# Patient Record
Sex: Female | Born: 1970 | Race: White | Hispanic: No | Smoking: Current every day smoker
Health system: Southern US, Community
[De-identification: ages and names within clinical notes are randomized; demographics above are authoritative.]

## PROBLEM LIST (undated history)

## (undated) DIAGNOSIS — E785 Hyperlipidemia, unspecified: Secondary | ICD-10-CM

## (undated) DIAGNOSIS — F419 Anxiety disorder, unspecified: Secondary | ICD-10-CM

## (undated) DIAGNOSIS — F32A Depression, unspecified: Secondary | ICD-10-CM

## (undated) DIAGNOSIS — B20 Human immunodeficiency virus [HIV] disease: Secondary | ICD-10-CM

## (undated) DIAGNOSIS — F329 Major depressive disorder, single episode, unspecified: Secondary | ICD-10-CM

## (undated) DIAGNOSIS — I10 Essential (primary) hypertension: Secondary | ICD-10-CM

## (undated) DIAGNOSIS — Z21 Asymptomatic human immunodeficiency virus [HIV] infection status: Secondary | ICD-10-CM

## (undated) DIAGNOSIS — M1990 Unspecified osteoarthritis, unspecified site: Secondary | ICD-10-CM

## (undated) DIAGNOSIS — I219 Acute myocardial infarction, unspecified: Secondary | ICD-10-CM

## (undated) DIAGNOSIS — D649 Anemia, unspecified: Secondary | ICD-10-CM

## (undated) HISTORY — DX: Hyperlipidemia, unspecified: E78.5

## (undated) HISTORY — DX: Essential (primary) hypertension: I10

## (undated) HISTORY — DX: Asymptomatic human immunodeficiency virus (hiv) infection status: Z21

## (undated) HISTORY — PX: BREAST ENHANCEMENT SURGERY: SHX7

## (undated) HISTORY — DX: Depression, unspecified: F32.A

## (undated) HISTORY — PX: OTHER SURGICAL HISTORY: SHX169

## (undated) HISTORY — DX: Anxiety disorder, unspecified: F41.9

## (undated) HISTORY — PX: CHOLECYSTECTOMY: SHX55

## (undated) HISTORY — DX: Anemia, unspecified: D64.9

## (undated) HISTORY — DX: Unspecified osteoarthritis, unspecified site: M19.90

## (undated) HISTORY — DX: Human immunodeficiency virus (HIV) disease: B20

## (undated) HISTORY — DX: Acute myocardial infarction, unspecified: I21.9

## (undated) HISTORY — DX: Major depressive disorder, single episode, unspecified: F32.9

---

## 2004-06-10 HISTORY — PX: TUBAL LIGATION: SHX77

## 2011-03-25 DIAGNOSIS — R87612 Low grade squamous intraepithelial lesion on cytologic smear of cervix (LGSIL): Secondary | ICD-10-CM | POA: Insufficient documentation

## 2013-06-22 DIAGNOSIS — Z9851 Tubal ligation status: Secondary | ICD-10-CM | POA: Diagnosis not present

## 2013-06-22 DIAGNOSIS — N920 Excessive and frequent menstruation with regular cycle: Secondary | ICD-10-CM | POA: Diagnosis not present

## 2013-06-22 DIAGNOSIS — Z21 Asymptomatic human immunodeficiency virus [HIV] infection status: Secondary | ICD-10-CM | POA: Diagnosis not present

## 2013-06-22 DIAGNOSIS — I25810 Atherosclerosis of coronary artery bypass graft(s) without angina pectoris: Secondary | ICD-10-CM | POA: Diagnosis not present

## 2013-06-22 DIAGNOSIS — Z9089 Acquired absence of other organs: Secondary | ICD-10-CM | POA: Diagnosis not present

## 2013-06-22 DIAGNOSIS — E119 Type 2 diabetes mellitus without complications: Secondary | ICD-10-CM | POA: Diagnosis not present

## 2013-06-22 DIAGNOSIS — Z87891 Personal history of nicotine dependence: Secondary | ICD-10-CM | POA: Diagnosis not present

## 2013-06-22 DIAGNOSIS — Z882 Allergy status to sulfonamides status: Secondary | ICD-10-CM | POA: Diagnosis not present

## 2013-06-22 DIAGNOSIS — I252 Old myocardial infarction: Secondary | ICD-10-CM | POA: Diagnosis not present

## 2013-06-22 DIAGNOSIS — Z79899 Other long term (current) drug therapy: Secondary | ICD-10-CM | POA: Diagnosis not present

## 2013-06-22 DIAGNOSIS — Z7982 Long term (current) use of aspirin: Secondary | ICD-10-CM | POA: Diagnosis not present

## 2013-06-22 DIAGNOSIS — E785 Hyperlipidemia, unspecified: Secondary | ICD-10-CM | POA: Diagnosis not present

## 2013-06-22 DIAGNOSIS — Z01818 Encounter for other preprocedural examination: Secondary | ICD-10-CM | POA: Diagnosis not present

## 2013-06-29 DIAGNOSIS — Z79899 Other long term (current) drug therapy: Secondary | ICD-10-CM | POA: Diagnosis not present

## 2013-06-29 DIAGNOSIS — Z882 Allergy status to sulfonamides status: Secondary | ICD-10-CM | POA: Diagnosis not present

## 2013-06-29 DIAGNOSIS — Z9889 Other specified postprocedural states: Secondary | ICD-10-CM | POA: Diagnosis not present

## 2013-06-29 DIAGNOSIS — R112 Nausea with vomiting, unspecified: Secondary | ICD-10-CM | POA: Diagnosis not present

## 2013-06-29 DIAGNOSIS — E785 Hyperlipidemia, unspecified: Secondary | ICD-10-CM | POA: Diagnosis not present

## 2013-06-29 DIAGNOSIS — Z87891 Personal history of nicotine dependence: Secondary | ICD-10-CM | POA: Diagnosis not present

## 2013-06-29 DIAGNOSIS — Z7982 Long term (current) use of aspirin: Secondary | ICD-10-CM | POA: Diagnosis not present

## 2013-06-29 DIAGNOSIS — Z21 Asymptomatic human immunodeficiency virus [HIV] infection status: Secondary | ICD-10-CM | POA: Diagnosis not present

## 2013-06-29 DIAGNOSIS — I252 Old myocardial infarction: Secondary | ICD-10-CM | POA: Diagnosis not present

## 2013-06-29 DIAGNOSIS — N920 Excessive and frequent menstruation with regular cycle: Secondary | ICD-10-CM | POA: Diagnosis not present

## 2013-08-18 DIAGNOSIS — N920 Excessive and frequent menstruation with regular cycle: Secondary | ICD-10-CM | POA: Diagnosis not present

## 2013-08-18 DIAGNOSIS — I2510 Atherosclerotic heart disease of native coronary artery without angina pectoris: Secondary | ICD-10-CM | POA: Diagnosis not present

## 2013-08-18 DIAGNOSIS — Z87891 Personal history of nicotine dependence: Secondary | ICD-10-CM | POA: Diagnosis not present

## 2013-08-18 DIAGNOSIS — J329 Chronic sinusitis, unspecified: Secondary | ICD-10-CM | POA: Diagnosis not present

## 2013-08-18 DIAGNOSIS — G2581 Restless legs syndrome: Secondary | ICD-10-CM | POA: Diagnosis not present

## 2013-08-18 DIAGNOSIS — B20 Human immunodeficiency virus [HIV] disease: Secondary | ICD-10-CM | POA: Diagnosis not present

## 2014-01-05 DIAGNOSIS — R079 Chest pain, unspecified: Secondary | ICD-10-CM | POA: Diagnosis not present

## 2014-01-05 DIAGNOSIS — I2510 Atherosclerotic heart disease of native coronary artery without angina pectoris: Secondary | ICD-10-CM | POA: Diagnosis not present

## 2014-01-05 DIAGNOSIS — Z1231 Encounter for screening mammogram for malignant neoplasm of breast: Secondary | ICD-10-CM | POA: Diagnosis not present

## 2014-01-12 DIAGNOSIS — N898 Other specified noninflammatory disorders of vagina: Secondary | ICD-10-CM | POA: Diagnosis not present

## 2014-01-12 DIAGNOSIS — N760 Acute vaginitis: Secondary | ICD-10-CM | POA: Diagnosis not present

## 2014-01-12 DIAGNOSIS — A499 Bacterial infection, unspecified: Secondary | ICD-10-CM | POA: Diagnosis not present

## 2014-01-12 DIAGNOSIS — B9689 Other specified bacterial agents as the cause of diseases classified elsewhere: Secondary | ICD-10-CM | POA: Diagnosis not present

## 2014-01-27 DIAGNOSIS — R072 Precordial pain: Secondary | ICD-10-CM | POA: Diagnosis not present

## 2014-01-27 DIAGNOSIS — R109 Unspecified abdominal pain: Secondary | ICD-10-CM | POA: Diagnosis not present

## 2014-02-02 DIAGNOSIS — R918 Other nonspecific abnormal finding of lung field: Secondary | ICD-10-CM | POA: Diagnosis not present

## 2014-02-02 DIAGNOSIS — Z882 Allergy status to sulfonamides status: Secondary | ICD-10-CM | POA: Diagnosis not present

## 2014-02-02 DIAGNOSIS — Z9851 Tubal ligation status: Secondary | ICD-10-CM | POA: Diagnosis not present

## 2014-02-02 DIAGNOSIS — R079 Chest pain, unspecified: Secondary | ICD-10-CM | POA: Diagnosis not present

## 2014-02-02 DIAGNOSIS — Z7982 Long term (current) use of aspirin: Secondary | ICD-10-CM | POA: Diagnosis not present

## 2014-02-02 DIAGNOSIS — Z9089 Acquired absence of other organs: Secondary | ICD-10-CM | POA: Diagnosis not present

## 2014-02-02 DIAGNOSIS — D649 Anemia, unspecified: Secondary | ICD-10-CM | POA: Diagnosis not present

## 2014-02-02 DIAGNOSIS — I252 Old myocardial infarction: Secondary | ICD-10-CM | POA: Diagnosis not present

## 2014-02-02 DIAGNOSIS — Z9861 Coronary angioplasty status: Secondary | ICD-10-CM | POA: Diagnosis not present

## 2014-02-02 DIAGNOSIS — Z79899 Other long term (current) drug therapy: Secondary | ICD-10-CM | POA: Diagnosis not present

## 2014-02-02 DIAGNOSIS — Z21 Asymptomatic human immunodeficiency virus [HIV] infection status: Secondary | ICD-10-CM | POA: Diagnosis not present

## 2014-02-02 DIAGNOSIS — Z9889 Other specified postprocedural states: Secondary | ICD-10-CM | POA: Diagnosis not present

## 2014-02-02 DIAGNOSIS — R0789 Other chest pain: Secondary | ICD-10-CM | POA: Diagnosis not present

## 2014-02-02 DIAGNOSIS — K219 Gastro-esophageal reflux disease without esophagitis: Secondary | ICD-10-CM | POA: Diagnosis not present

## 2014-02-02 DIAGNOSIS — Z87891 Personal history of nicotine dependence: Secondary | ICD-10-CM | POA: Diagnosis not present

## 2014-02-02 DIAGNOSIS — E785 Hyperlipidemia, unspecified: Secondary | ICD-10-CM | POA: Diagnosis not present

## 2014-02-03 DIAGNOSIS — R0789 Other chest pain: Secondary | ICD-10-CM | POA: Diagnosis not present

## 2014-02-03 DIAGNOSIS — R1012 Left upper quadrant pain: Secondary | ICD-10-CM | POA: Diagnosis not present

## 2014-02-04 DIAGNOSIS — K2960 Other gastritis without bleeding: Secondary | ICD-10-CM | POA: Diagnosis not present

## 2014-02-04 DIAGNOSIS — K2940 Chronic atrophic gastritis without bleeding: Secondary | ICD-10-CM | POA: Diagnosis not present

## 2014-02-04 DIAGNOSIS — K2990 Gastroduodenitis, unspecified, without bleeding: Secondary | ICD-10-CM | POA: Diagnosis not present

## 2014-02-04 DIAGNOSIS — K2970 Gastritis, unspecified, without bleeding: Secondary | ICD-10-CM | POA: Diagnosis not present

## 2014-02-04 DIAGNOSIS — R1012 Left upper quadrant pain: Secondary | ICD-10-CM | POA: Diagnosis not present

## 2014-02-04 DIAGNOSIS — R110 Nausea: Secondary | ICD-10-CM | POA: Diagnosis not present

## 2014-03-02 DIAGNOSIS — R5383 Other fatigue: Secondary | ICD-10-CM | POA: Diagnosis not present

## 2014-03-02 DIAGNOSIS — R5381 Other malaise: Secondary | ICD-10-CM | POA: Diagnosis not present

## 2014-03-02 DIAGNOSIS — I2510 Atherosclerotic heart disease of native coronary artery without angina pectoris: Secondary | ICD-10-CM | POA: Diagnosis not present

## 2014-03-02 DIAGNOSIS — Z87891 Personal history of nicotine dependence: Secondary | ICD-10-CM | POA: Diagnosis not present

## 2014-03-02 DIAGNOSIS — R635 Abnormal weight gain: Secondary | ICD-10-CM | POA: Diagnosis not present

## 2014-03-02 DIAGNOSIS — Z21 Asymptomatic human immunodeficiency virus [HIV] infection status: Secondary | ICD-10-CM | POA: Diagnosis not present

## 2014-03-02 DIAGNOSIS — K5900 Constipation, unspecified: Secondary | ICD-10-CM | POA: Diagnosis not present

## 2014-03-02 DIAGNOSIS — G2581 Restless legs syndrome: Secondary | ICD-10-CM | POA: Diagnosis not present

## 2014-03-02 DIAGNOSIS — B20 Human immunodeficiency virus [HIV] disease: Secondary | ICD-10-CM | POA: Diagnosis not present

## 2014-03-14 DIAGNOSIS — B20 Human immunodeficiency virus [HIV] disease: Secondary | ICD-10-CM | POA: Diagnosis not present

## 2014-03-14 DIAGNOSIS — R85612 Low grade squamous intraepithelial lesion on cytologic smear of anus (LGSIL): Secondary | ICD-10-CM | POA: Diagnosis not present

## 2014-03-14 DIAGNOSIS — R85613 High grade squamous intraepithelial lesion on cytologic smear of anus (HGSIL): Secondary | ICD-10-CM | POA: Diagnosis not present

## 2014-03-14 DIAGNOSIS — R85611 Atypical squamous cells cannot exclude high grade squamous intraepithelial lesion on cytologic smear of anus (ASC-H): Secondary | ICD-10-CM | POA: Diagnosis not present

## 2014-04-04 DIAGNOSIS — Z23 Encounter for immunization: Secondary | ICD-10-CM | POA: Diagnosis not present

## 2014-04-04 DIAGNOSIS — B20 Human immunodeficiency virus [HIV] disease: Secondary | ICD-10-CM | POA: Diagnosis not present

## 2014-06-01 DIAGNOSIS — J00 Acute nasopharyngitis [common cold]: Secondary | ICD-10-CM | POA: Diagnosis not present

## 2014-06-05 DIAGNOSIS — J00 Acute nasopharyngitis [common cold]: Secondary | ICD-10-CM | POA: Diagnosis not present

## 2014-06-05 DIAGNOSIS — J019 Acute sinusitis, unspecified: Secondary | ICD-10-CM | POA: Diagnosis not present

## 2014-10-04 DIAGNOSIS — R85613 High grade squamous intraepithelial lesion on cytologic smear of anus (HGSIL): Secondary | ICD-10-CM | POA: Diagnosis not present

## 2014-10-04 DIAGNOSIS — R85611 Atypical squamous cells cannot exclude high grade squamous intraepithelial lesion on cytologic smear of anus (ASC-H): Secondary | ICD-10-CM | POA: Diagnosis not present

## 2014-10-04 DIAGNOSIS — Z006 Encounter for examination for normal comparison and control in clinical research program: Secondary | ICD-10-CM | POA: Diagnosis not present

## 2014-10-04 DIAGNOSIS — R85612 Low grade squamous intraepithelial lesion on cytologic smear of anus (LGSIL): Secondary | ICD-10-CM | POA: Diagnosis not present

## 2014-10-04 DIAGNOSIS — B20 Human immunodeficiency virus [HIV] disease: Secondary | ICD-10-CM | POA: Diagnosis not present

## 2014-10-05 DIAGNOSIS — B20 Human immunodeficiency virus [HIV] disease: Secondary | ICD-10-CM | POA: Diagnosis not present

## 2014-10-05 DIAGNOSIS — E611 Iron deficiency: Secondary | ICD-10-CM | POA: Diagnosis not present

## 2014-10-05 DIAGNOSIS — I2510 Atherosclerotic heart disease of native coronary artery without angina pectoris: Secondary | ICD-10-CM | POA: Diagnosis not present

## 2014-12-07 ENCOUNTER — Telehealth: Payer: Self-pay | Admitting: Family Medicine

## 2014-12-07 NOTE — Telephone Encounter (Signed)
Stp and she was previous pt of Dr.Stacks, needs to establish care and get immunizations for school/intern. Pt given new pt appt with Lynwood Dawleyiffany Gann 7/8 at 4:25 and aware to arrive 30 minutes prior with a copy of her insurance card and a valid photo ID as well as a current list of her medications.

## 2014-12-13 ENCOUNTER — Encounter: Payer: Self-pay | Admitting: Family Medicine

## 2014-12-13 ENCOUNTER — Encounter (INDEPENDENT_AMBULATORY_CARE_PROVIDER_SITE_OTHER): Payer: Self-pay

## 2014-12-13 ENCOUNTER — Ambulatory Visit (INDEPENDENT_AMBULATORY_CARE_PROVIDER_SITE_OTHER): Payer: Medicare Other

## 2014-12-13 ENCOUNTER — Ambulatory Visit (INDEPENDENT_AMBULATORY_CARE_PROVIDER_SITE_OTHER): Payer: Medicare Other | Admitting: Family Medicine

## 2014-12-13 VITALS — BP 120/79 | HR 60 | Temp 97.3°F | Ht 59.0 in | Wt 147.4 lb

## 2014-12-13 DIAGNOSIS — Z139 Encounter for screening, unspecified: Secondary | ICD-10-CM | POA: Diagnosis not present

## 2014-12-13 DIAGNOSIS — Z21 Asymptomatic human immunodeficiency virus [HIV] infection status: Secondary | ICD-10-CM | POA: Diagnosis not present

## 2014-12-13 DIAGNOSIS — R635 Abnormal weight gain: Secondary | ICD-10-CM | POA: Diagnosis not present

## 2014-12-13 DIAGNOSIS — Z201 Contact with and (suspected) exposure to tuberculosis: Secondary | ICD-10-CM | POA: Diagnosis not present

## 2014-12-13 DIAGNOSIS — E785 Hyperlipidemia, unspecified: Secondary | ICD-10-CM | POA: Diagnosis not present

## 2014-12-13 DIAGNOSIS — R7611 Nonspecific reaction to tuberculin skin test without active tuberculosis: Secondary | ICD-10-CM

## 2014-12-13 DIAGNOSIS — N760 Acute vaginitis: Secondary | ICD-10-CM

## 2014-12-13 LAB — POCT CBC
Granulocyte percent: 64.3 %G (ref 37–80)
HCT, POC: 42.6 % (ref 37.7–47.9)
Hemoglobin: 14.5 g/dL (ref 12.2–16.2)
LYMPH, POC: 3.5 — AB (ref 0.6–3.4)
MCH: 31.6 pg — AB (ref 27–31.2)
MCHC: 34 g/dL (ref 31.8–35.4)
MCV: 92.8 fL (ref 80–97)
MPV: 8.8 fL (ref 0–99.8)
PLATELET COUNT, POC: 202 10*3/uL (ref 142–424)
POC Granulocyte: 7.8 — AB (ref 2–6.9)
POC LYMPH PERCENT: 28.4 %L (ref 10–50)
RBC: 4.59 M/uL (ref 4.04–5.48)
RDW, POC: 12.9 %
WBC: 12.2 10*3/uL — AB (ref 4.6–10.2)

## 2014-12-13 MED ORDER — FLUCONAZOLE 150 MG PO TABS: 150.0000 mg | ORAL_TABLET | Freq: Every day | ORAL | Status: DC

## 2014-12-13 MED ORDER — PHENTERMINE HCL 37.5 MG PO CAPS: 37.5000 mg | ORAL_CAPSULE | ORAL | Status: DC

## 2014-12-13 NOTE — Progress Notes (Signed)
Subjective:  Patient ID: Gina Alexander, female    DOB: Nov 10, 1970  Age: 44 y.o. MRN: [id number]  CC: Hyperlipidemia and Vaginitis   HPI Gina Alexander presents for Patient in for follow-up of elevated cholesterol. Doing well without complaints on current medication. Denies side effects of statin including myalgia and arthralgia and nausea. Also in today for liver function testing. Currently no chest pain, shortness of breath or other cardiovascular related symptoms noted.  Patient has noted some vaginal itching and scant discharge. There has been no odor. Onset about 3-4 days ago. She thinks her husband has given her some he stopped because he recently had some antibiotic since subsequently yeast infection. They have been active together.  Patient continues to follow with infectious disease for her HIV disease. Of note is that she has been taking medication listed below. Her CD4 count has been good and her viral load has been undetectable for several years now. She denies any infection that could be representative opportunistic infection  History Gina Alexander has a past medical history of Myocardial infarction; Hyperlipidemia; and HIV infection.   She has past surgical history that includes Cholecystectomy; Tubal ligation (2006); uterine ablation; and Breast enhancement surgery.   Her family history includes Arthritis in her mother; COPD in her mother.She reports that she has quit smoking. She quit smokeless tobacco use about 11 months ago. She reports that she drinks about 1.2 oz of alcohol per week. She reports that she does not use illicit drugs.  No outpatient prescriptions prior to visit.   No facility-administered medications prior to visit.    ROS Review of Systems  Constitutional: Positive for appetite change and unexpected weight change (quit smoking last fall. She has put on but for her seems to be an excessive amount of weight. She would like to have testing.). Negative for  fever, chills, diaphoresis and fatigue.  HENT: Negative for congestion, ear pain, hearing loss, postnasal drip, rhinorrhea, sneezing, sore throat and trouble swallowing.   Eyes: Negative for pain.  Respiratory: Negative for cough, chest tightness and shortness of breath.   Cardiovascular: Negative for chest pain and palpitations.  Gastrointestinal: Negative for nausea, vomiting, abdominal pain, diarrhea and constipation.  Genitourinary: Positive for vaginal discharge. Negative for dysuria, frequency and menstrual problem.  Musculoskeletal: Negative for joint swelling and arthralgias.  Skin: Negative for rash.  Neurological: Negative for dizziness, weakness, numbness and headaches.  Psychiatric/Behavioral: Negative for dysphoric mood and agitation.    Objective:  BP 120/79 mmHg  Pulse 60  Temp(Src) 97.3 F (36.3 C) (Oral)  Ht 4' 11"  (1.499 m)  Wt 147 lb 6.4 oz (66.86 kg)  BMI 29.76 kg/m2  LMP 12/12/2012  BP Readings from Last 3 Encounters:  12/13/14 120/79    Wt Readings from Last 3 Encounters:  12/13/14 147 lb 6.4 oz (66.86 kg)     Physical Exam  Constitutional: She is oriented to person, place, and time. She appears well-developed and well-nourished. No distress.  HENT:  Head: Normocephalic and atraumatic.  Right Ear: External ear normal.  Left Ear: External ear normal.  Nose: Nose normal.  Mouth/Throat: Oropharynx is clear and moist.  Eyes: Conjunctivae and EOM are normal. Pupils are equal, round, and reactive to light.  Neck: Normal range of motion. Neck supple. No thyromegaly present.  Cardiovascular: Normal rate, regular rhythm and normal heart sounds.   No murmur heard. Pulmonary/Chest: Effort normal and breath sounds normal. No respiratory distress. She has no wheezes. She has no rales.  Abdominal: Soft. Bowel  sounds are normal. She exhibits no distension. There is no tenderness.  Lymphadenopathy:    She has no cervical adenopathy.  Neurological: She is alert  and oriented to person, place, and time. She has normal reflexes.  Skin: Skin is warm and dry.  Psychiatric: She has a normal mood and affect. Her behavior is normal. Judgment and thought content normal.    No results found for: HGBA1C  Lab Results  Component Value Date   WBC 12.2* 12/13/2014   HGB 14.5 12/13/2014   HCT 42.6 12/13/2014    Patient was never admitted.  Assessment & Plan:   Gina Alexander was seen today for hyperlipidemia and vaginitis.  Diagnoses and all orders for this visit:  Vaginitis and vulvovaginitis Orders: -     POCT CBC -     CMP14+EGFR -     TSH  Screening Orders: -     DG Chest 2 View -     Varicella zoster antibody, IgG  Asymptomatic HIV infection Orders: -     POCT CBC -     CMP14+EGFR -     TSH  Weight gain, abnormal Orders: -     POCT CBC -     CMP14+EGFR -     TSH  Exposure to TB Orders: -     CMP14+EGFR -     TSH  Hyperlipidemia Orders: -     Lipid panel  Other orders -     fluconazole (DIFLUCAN) 150 MG tablet; Take 1 tablet (150 mg total) by mouth daily. -     phentermine 37.5 MG capsule; Take 1 capsule (37.5 mg total) by mouth every morning.   I am having Gina Alexander start on fluconazole and phentermine. I am also having her maintain her atorvastatin, aspirin, ferrous sulfate, Abacavir-Dolutegravir-Lamivud, and fluocinonide-emollient.  Meds ordered this encounter  Medications  . atorvastatin (LIPITOR) 80 MG tablet    Sig: Take 40 mg by mouth.  Marland Kitchen aspirin 81 MG chewable tablet    Sig: Chew 81 mg by mouth.  . ferrous sulfate 325 (65 FE) MG tablet    Sig: Take 325 mg by mouth.  . Abacavir-Dolutegravir-Lamivud (TRIUMEQ) 600-50-300 MG TABS    Sig: Take 1 tablet by mouth daily.   . fluocinonide-emollient (LIDEX-E) 0.05 % cream    Sig: APPLY SPARINGLY TO AFFECTED AREAS TWICE DAILY    Refill:  5  . fluconazole (DIFLUCAN) 150 MG tablet    Sig: Take 1 tablet (150 mg total) by mouth daily.    Dispense:  5 tablet    Refill:  0   . phentermine 37.5 MG capsule    Sig: Take 1 capsule (37.5 mg total) by mouth every morning.    Dispense:  30 capsule    Refill:  2     Follow-up: Return in about 6 months (around 06/15/2015).  Gina Alexander, M.D.

## 2014-12-14 ENCOUNTER — Encounter: Payer: Self-pay | Admitting: Family Medicine

## 2014-12-14 LAB — VARICELLA ZOSTER ANTIBODY, IGG: VARICELLA: 243 {index} (ref >165)

## 2014-12-14 LAB — LIPID PANEL
CHOLESTEROL TOTAL: 107 mg/dL (ref 100–199)
Chol/HDL Ratio: 3.2 ratio units (ref 0.0–4.4)
HDL: 33 mg/dL — ABNORMAL LOW (ref >39)
LDL Calculated: 44 mg/dL (ref 0–99)
Triglycerides: 150 mg/dL — ABNORMAL HIGH (ref 0–149)
VLDL Cholesterol Cal: 30 mg/dL (ref 5–40)

## 2014-12-14 LAB — CMP14+EGFR
A/G RATIO: 1.6 (ref 1.1–2.5)
ALBUMIN: 4.4 g/dL (ref 3.5–5.5)
ALT: 22 IU/L (ref 0–32)
AST: 19 IU/L (ref 0–40)
Alkaline Phosphatase: 88 IU/L (ref 39–117)
BUN/Creatinine Ratio: 13 (ref 9–23)
BUN: 12 mg/dL (ref 6–24)
Bilirubin Total: 0.5 mg/dL (ref 0.0–1.2)
CALCIUM: 9.3 mg/dL (ref 8.7–10.2)
CO2: 20 mmol/L (ref 18–29)
Chloride: 104 mmol/L (ref 97–108)
Creatinine, Ser: 0.94 mg/dL (ref 0.57–1.00)
GFR calc Af Amer: 87 mL/min/{1.73_m2} (ref >59)
GFR, EST NON AFRICAN AMERICAN: 75 mL/min/{1.73_m2} (ref >59)
Globulin, Total: 2.7 g/dL (ref 1.5–4.5)
Glucose: 94 mg/dL (ref 65–99)
POTASSIUM: 4.1 mmol/L (ref 3.5–5.2)
Sodium: 141 mmol/L (ref 134–144)
Total Protein: 7.1 g/dL (ref 6.0–8.5)

## 2014-12-14 LAB — TSH: TSH: 1.5 u[IU]/mL (ref 0.450–4.500)

## 2014-12-15 ENCOUNTER — Encounter: Payer: Self-pay | Admitting: *Deleted

## 2014-12-16 ENCOUNTER — Ambulatory Visit: Payer: Self-pay | Admitting: Physician Assistant

## 2015-01-02 ENCOUNTER — Ambulatory Visit (INDEPENDENT_AMBULATORY_CARE_PROVIDER_SITE_OTHER): Payer: Medicare Other | Admitting: Family Medicine

## 2015-01-02 ENCOUNTER — Encounter: Payer: Self-pay | Admitting: Family Medicine

## 2015-01-02 VITALS — BP 138/87 | HR 75 | Temp 98.4°F | Ht 59.0 in | Wt 142.0 lb

## 2015-01-02 DIAGNOSIS — R52 Pain, unspecified: Secondary | ICD-10-CM | POA: Diagnosis not present

## 2015-01-02 DIAGNOSIS — J029 Acute pharyngitis, unspecified: Secondary | ICD-10-CM

## 2015-01-02 LAB — POCT INFLUENZA A/B
Influenza A, POC: NEGATIVE
Influenza B, POC: NEGATIVE

## 2015-01-02 LAB — POCT RAPID STREP A (OFFICE): RAPID STREP A SCREEN: NEGATIVE

## 2015-01-02 MED ORDER — LEVOFLOXACIN 500 MG PO TABS: 500.0000 mg | ORAL_TABLET | Freq: Every day | ORAL | Status: DC

## 2015-01-02 MED ORDER — FLUCONAZOLE 150 MG PO TABS: 150.0000 mg | ORAL_TABLET | Freq: Every day | ORAL | Status: DC

## 2015-01-02 MED ORDER — HYDROCODONE-HOMATROPINE 5-1.5 MG/5ML PO SYRP: 5.0000 mL | ORAL_SOLUTION | Freq: Four times a day (QID) | ORAL | Status: DC | PRN

## 2015-01-02 NOTE — Progress Notes (Signed)
Subjective:  Patient ID: Gina Alexander, female    DOB: [birth year]  Age: 44 y.o. MRN: [id number]  CC: Sore Throat; Generalized Body Aches; Fever; and Cough   HPI Gina Alexander presents for 4 days of sore throat pain. Symptoms are gradually worsening. She has an intense frontal and parietal headache. She has used excessive Excedrin PM to get relief with only poor relief of 6/10 pain. The sore throat she says is like pop rocks going off in her throat. She has a rather intense cough which is dry. There is no shortness of breath. She has had some subjective fever with no chills but feels cold at night. She has been resting in bed to help relieve her symptoms as well. History Gina Alexander has a past medical history of Myocardial infarction; Hyperlipidemia; and HIV infection.   She has past surgical history that includes Cholecystectomy; Tubal ligation (2006); uterine ablation; and Breast enhancement surgery.   Her family history includes Arthritis in her mother; COPD in her mother.She reports that she has quit smoking. She quit smokeless tobacco use about a year ago. She reports that she drinks about 1.2 oz of alcohol per week. She reports that she does not use illicit drugs.  Outpatient Prescriptions Prior to Visit  Medication Sig Dispense Refill  . Abacavir-Dolutegravir-Lamivud (TRIUMEQ) 600-50-300 MG TABS Take 1 tablet by mouth daily.     Marland Kitchen aspirin 81 MG chewable tablet Chew 81 mg by mouth.    Marland Kitchen atorvastatin (LIPITOR) 80 MG tablet Take 40 mg by mouth.    . ferrous sulfate 325 (65 FE) MG tablet Take 325 mg by mouth.    . fluocinonide-emollient (LIDEX-E) 0.05 % cream APPLY SPARINGLY TO AFFECTED AREAS TWICE DAILY  5  . phentermine 37.5 MG capsule Take 1 capsule (37.5 mg total) by mouth every morning. 30 capsule 2  . fluconazole (DIFLUCAN) 150 MG tablet Take 1 tablet (150 mg total) by mouth daily. 5 tablet 0   No facility-administered medications prior to visit.    ROS Review of Systems    Constitutional: Negative for fever, chills, activity change and appetite change.  HENT: Positive for congestion, sinus pressure, sore throat and trouble swallowing. Negative for ear discharge, ear pain, hearing loss, nosebleeds, postnasal drip, rhinorrhea and sneezing.   Respiratory: Positive for chest tightness. Negative for shortness of breath.   Cardiovascular: Negative for chest pain and palpitations.  Skin: Negative for rash.    Objective:  BP 138/87 mmHg  Pulse 75  Temp(Src) 98.4 F (36.9 C) (Oral)  Ht 4\' 11"  (1.499 m)  Wt 142 lb (64.411 kg)  BMI 28.67 kg/m2  LMP 12/12/2012  BP Readings from Last 3 Encounters:  01/02/15 138/87  12/13/14 120/79    Wt Readings from Last 3 Encounters:  01/02/15 142 lb (64.411 kg)  12/13/14 147 lb 6.4 oz (66.86 kg)     Physical Exam  Constitutional: She appears well-developed and well-nourished.  HENT:  Head: Normocephalic and atraumatic.  Right Ear: Tympanic membrane and external ear normal. No decreased hearing is noted.  Left Ear: Tympanic membrane and external ear normal. No decreased hearing is noted.  Nose: Mucosal edema present. Right sinus exhibits no frontal sinus tenderness. Left sinus exhibits no frontal sinus tenderness.  Mouth/Throat: No oropharyngeal exudate or posterior oropharyngeal erythema.  Neck: No Brudzinski's sign noted.  Pulmonary/Chest: Breath sounds normal. No respiratory distress.  Lymphadenopathy:       Head (right side): No preauricular adenopathy present.       Head (left  side): No preauricular adenopathy present.       Right cervical: No superficial cervical adenopathy present.      Left cervical: No superficial cervical adenopathy present.    No results found for: HGBA1C  Lab Results  Component Value Date   WBC 12.2* 12/13/2014   HGB 14.5 12/13/2014   HCT 42.6 12/13/2014   GLUCOSE 94 12/13/2014   CHOL 107 12/13/2014   TRIG 150* 12/13/2014   HDL 33* 12/13/2014   LDLCALC 44 12/13/2014   ALT 22  12/13/2014   AST 19 12/13/2014   NA 141 12/13/2014   K 4.1 12/13/2014   CL 104 12/13/2014   CREATININE 0.94 12/13/2014   BUN 12 12/13/2014   CO2 20 12/13/2014   TSH 1.500 12/13/2014    Patient was never admitted.  Assessment & Plan:   Keelyn was seen today for sore throat, generalized body aches, fever and cough.  Diagnoses and all orders for this visit:  Sore throat Orders: -     POCT Influenza A/B -     POCT rapid strep A -     POCT rapid strep A  Body aches Orders: -     POCT Influenza A/B -     POCT rapid strep A -     POCT rapid strep A  Other orders -     levofloxacin (LEVAQUIN) 500 MG tablet; Take 1 tablet (500 mg total) by mouth daily. -     HYDROcodone-homatropine (HYCODAN) 5-1.5 MG/5ML syrup; Take 5 mLs by mouth every 6 (six) hours as needed for cough. -     fluconazole (DIFLUCAN) 150 MG tablet; Take 1 tablet (150 mg total) by mouth daily.   I am having Ms. Soltys start on levofloxacin and HYDROcodone-homatropine. I am also having her maintain her atorvastatin, aspirin, ferrous sulfate, Abacavir-Dolutegravir-Lamivud, fluocinonide-emollient, phentermine, and fluconazole.  Meds ordered this encounter  Medications  . DISCONTD: atorvastatin (LIPITOR) 40 MG tablet    Sig: TAKE 1 TABLET (40 MG TOTAL) BY MOUTH NIGHTLY.    Refill:  3  . levofloxacin (LEVAQUIN) 500 MG tablet    Sig: Take 1 tablet (500 mg total) by mouth daily.    Dispense:  7 tablet    Refill:  0  . HYDROcodone-homatropine (HYCODAN) 5-1.5 MG/5ML syrup    Sig: Take 5 mLs by mouth every 6 (six) hours as needed for cough.    Dispense:  120 mL    Refill:  0  . fluconazole (DIFLUCAN) 150 MG tablet    Sig: Take 1 tablet (150 mg total) by mouth daily.    Dispense:  5 tablet    Refill:  0     Follow-up: Return if symptoms worsen or fail to improve.  Mechele Claude, M.D.

## 2015-01-02 NOTE — Progress Notes (Signed)
  Gina Alexander is a 44 y.o. female presenting with a sore throat for 4 days.  Associated symptoms include:  fever, headache and muscle aches.  Symptoms are progressively worsening.  Home treatment thus far includes:  rest and NSAIDS/acetaminophen.  No known sick contacts with similar symptoms.  There is no history of of similar symptoms.  Exam:  BP 138/87 mmHg  Pulse 75  Temp(Src) 98.4 F (36.9 C) (Oral)  Ht  (1.499 m)  Wt 142 lb (64.411 kg)  BMI 28.67 kg/m2  LMP 12/12/2012

## 2015-01-09 ENCOUNTER — Encounter: Payer: Self-pay | Admitting: *Deleted

## 2015-03-14 ENCOUNTER — Ambulatory Visit (INDEPENDENT_AMBULATORY_CARE_PROVIDER_SITE_OTHER): Payer: Medicare Other | Admitting: Family Medicine

## 2015-03-14 ENCOUNTER — Encounter: Payer: Self-pay | Admitting: *Deleted

## 2015-03-14 VITALS — BP 134/93 | HR 63 | Temp 98.2°F | Ht 59.0 in | Wt 132.0 lb

## 2015-03-14 DIAGNOSIS — B009 Herpesviral infection, unspecified: Secondary | ICD-10-CM

## 2015-03-14 DIAGNOSIS — J340 Abscess, furuncle and carbuncle of nose: Secondary | ICD-10-CM

## 2015-03-14 MED ORDER — CLINDAMYCIN HCL 300 MG PO CAPS: 300.0000 mg | ORAL_CAPSULE | Freq: Three times a day (TID) | ORAL | Status: DC

## 2015-03-14 MED ORDER — VALACYCLOVIR HCL 1 G PO TABS: ORAL_TABLET | ORAL | Status: DC

## 2015-03-14 NOTE — Progress Notes (Signed)
Subjective:    Patient ID: Gina Alexander, female    DOB: June 27, 1970, 44 y.o.   MRN: [id number]  HPI  Pt is here due to facial swelling . The swelling and the patient's face started on Sunday 2 days ago. She is also complaining of an earache headache and sinus pressure.     Patient Active Problem List   Diagnosis Date Noted  . Asymptomatic HIV infection (HCC) 12/13/2014  . Exposure to TB 12/13/2014  . Vaginitis and vulvovaginitis 12/13/2014  . Hyperlipidemia 12/13/2014   Outpatient Encounter Prescriptions as of 03/14/2015  Medication Sig  . Abacavir-Dolutegravir-Lamivud (TRIUMEQ) 600-50-300 MG TABS Take 1 tablet by mouth daily.   Marland Kitchen aspirin 81 MG chewable tablet Chew 81 mg by mouth.  Marland Kitchen atorvastatin (LIPITOR) 80 MG tablet Take 40 mg by mouth.  . ferrous sulfate 325 (65 FE) MG tablet Take 325 mg by mouth.  . fluconazole (DIFLUCAN) 150 MG tablet Take 1 tablet (150 mg total) by mouth daily.  . fluocinonide-emollient (LIDEX-E) 0.05 % cream APPLY SPARINGLY TO AFFECTED AREAS TWICE DAILY  . HYDROcodone-homatropine (HYCODAN) 5-1.5 MG/5ML syrup Take 5 mLs by mouth every 6 (six) hours as needed for cough.  Marland Kitchen levofloxacin (LEVAQUIN) 500 MG tablet Take 1 tablet (500 mg total) by mouth daily.  . phentermine 37.5 MG capsule Take 1 capsule (37.5 mg total) by mouth every morning.   No facility-administered encounter medications on file as of 03/14/2015.        Review of Systems  Constitutional: Negative.   HENT: Positive for ear pain, facial swelling and sinus pressure.   Eyes: Negative.   Respiratory: Negative.   Cardiovascular: Negative.   Gastrointestinal: Negative.   Endocrine: Negative.   Genitourinary: Negative.   Musculoskeletal: Negative.   Skin: Negative.   Allergic/Immunologic: Negative.   Neurological: Positive for headaches.  Hematological: Negative.   Psychiatric/Behavioral: Negative.        Objective:   Physical Exam  Constitutional: She is oriented to person,  place, and time. She appears well-developed and well-nourished.  HENT:  Head: Normocephalic and atraumatic.  Right Ear: External ear normal.  Left Ear: External ear normal.  Mouth/Throat: Oropharynx is clear and moist.  There is swelling of the nose and nasal septal area. The area just inside the nostril is very tender especially on the right side.  Eyes: Conjunctivae and EOM are normal. Pupils are equal, round, and reactive to light. Right eye exhibits no discharge. Left eye exhibits no discharge. No scleral icterus.  Neck: Normal range of motion. Neck supple. No thyromegaly present.  Cardiovascular: Normal rate, regular rhythm and normal heart sounds.   No murmur heard. Pulmonary/Chest: Effort normal and breath sounds normal. No respiratory distress. She has no wheezes. She has no rales. She exhibits no tenderness.  Musculoskeletal: Normal range of motion.  Lymphadenopathy:    She has cervical adenopathy.  Neurological: She is alert and oriented to person, place, and time.  Skin: Skin is warm and dry. No rash noted. There is erythema.  There is erythema and swelling of the nose as well as the anterior nasal septum  Psychiatric: She has a normal mood and affect. Her behavior is normal. Judgment and thought content normal.  Nursing note and vitals reviewed.   BP 134/93 mmHg  Pulse 63  Temp(Src) 98.2 F (36.8 C) (Oral)  Ht  (1.499 m)  Wt 132 lb (59.875 kg)  BMI 26.65 kg/m2       Assessment & Plan:  1. Cellulitis of  nasal tip- clindamycin (CLEOCIN) 300 MG capsule; Take 1 capsule (300 mg total) by mouth 3 (three) times daily.  Dispense: 30 capsule; Refill: 1  2. Herpes simplex -Patient does have a history of herpes simplex infections and this could be a herpes simplex cellulitis. - valACYclovir (VALTREX) 1000 MG tablet; Take 2 immediately and 2 pills 12 hours later and then as directed  Dispense: 20 tablet; Refill: 0  Patient Instructions  Take antibiotic as directed 3  times daily until completed Take Tylenol for aches pains and fever Also take medicine for cold sore as directed 2 pills stat and 2 pills 12 hours later and then only as needed after that      Nyra Capes MD

## 2015-03-14 NOTE — Patient Instructions (Addendum)
Take antibiotic as directed 3 times daily until completed Take Tylenol for aches pains and fever Also take medicine for cold sore as directed 2 pills stat and 2 pills 12 hours later and then only as needed after that

## 2015-05-16 ENCOUNTER — Encounter: Payer: Self-pay | Admitting: Family Medicine

## 2015-05-17 ENCOUNTER — Other Ambulatory Visit: Payer: Self-pay | Admitting: Family Medicine

## 2015-05-17 DIAGNOSIS — B20 Human immunodeficiency virus [HIV] disease: Secondary | ICD-10-CM | POA: Diagnosis not present

## 2015-05-17 DIAGNOSIS — Z006 Encounter for examination for normal comparison and control in clinical research program: Secondary | ICD-10-CM | POA: Diagnosis not present

## 2015-05-17 DIAGNOSIS — Z79899 Other long term (current) drug therapy: Secondary | ICD-10-CM | POA: Diagnosis not present

## 2015-05-17 DIAGNOSIS — D013 Carcinoma in situ of anus and anal canal: Secondary | ICD-10-CM | POA: Diagnosis not present

## 2015-05-17 MED ORDER — PHENTERMINE HCL 37.5 MG PO CAPS: 37.5000 mg | ORAL_CAPSULE | ORAL | Status: DC

## 2015-05-17 NOTE — Progress Notes (Signed)
rx called into pharmacy

## 2015-06-15 ENCOUNTER — Ambulatory Visit: Payer: Medicare Other | Admitting: Family Medicine

## 2015-06-16 ENCOUNTER — Ambulatory Visit (INDEPENDENT_AMBULATORY_CARE_PROVIDER_SITE_OTHER): Payer: Medicare Other | Admitting: Family Medicine

## 2015-06-16 ENCOUNTER — Encounter: Payer: Self-pay | Admitting: Family Medicine

## 2015-06-16 VITALS — BP 121/76 | HR 67 | Temp 96.9°F | Ht 59.0 in | Wt 141.6 lb

## 2015-06-16 DIAGNOSIS — Z21 Asymptomatic human immunodeficiency virus [HIV] infection status: Secondary | ICD-10-CM | POA: Diagnosis not present

## 2015-06-16 DIAGNOSIS — D509 Iron deficiency anemia, unspecified: Secondary | ICD-10-CM | POA: Diagnosis not present

## 2015-06-16 DIAGNOSIS — E785 Hyperlipidemia, unspecified: Secondary | ICD-10-CM | POA: Diagnosis not present

## 2015-06-16 MED ORDER — FLUOCINONIDE-E 0.05 % EX CREA: TOPICAL_CREAM | CUTANEOUS | Status: DC

## 2015-06-16 MED ORDER — ATORVASTATIN CALCIUM 80 MG PO TABS: 40.0000 mg | ORAL_TABLET | Freq: Every day | ORAL | Status: DC

## 2015-06-16 NOTE — Progress Notes (Signed)
Subjective:  Patient ID: Gina Alexander, female    DOB: 06/06/1971  Age: 45 y.o. MRN: [id number]  CC: Hyperlipidemia   HPI Gina Alexander presents for Patient in for follow-up of elevated cholesterol. Doing well without complaints on current medication. Denies side effects of statin including myalgia and arthralgia and nausea. Also in today for liver function testing. Currently no chest pain, shortness of breath or other cardiovascular related symptoms noted. She also wants to have phentermine renewed due to some weight gain. She says it does help with her appetite when she uses intermittently. She continues to use her anti-HIV drugs. We reported to new allergies to previous HIV drugs today.  History Gina Alexander has a past medical history of Myocardial infarction ([place]); Hyperlipidemia; and HIV infection ([place]).   She has past surgical history that includes Cholecystectomy; Tubal ligation (2006); uterine ablation; and Breast enhancement surgery.   Her family history includes Arthritis in her mother; COPD in her mother.She reports that she has quit smoking. She quit smokeless tobacco use about 17 months ago. She reports that she drinks about 1.2 oz of alcohol per week. She reports that she does not use illicit drugs.  Outpatient Prescriptions Prior to Visit  Medication Sig Dispense Refill  . Abacavir-Dolutegravir-Lamivud (TRIUMEQ) 600-50-300 MG TABS Take 1 tablet by mouth daily.     Marland Kitchen aspirin 81 MG chewable tablet Chew 81 mg by mouth.    . clindamycin (CLEOCIN) 300 MG capsule Take 1 capsule (300 mg total) by mouth 3 (three) times daily. 30 capsule 1  . ferrous sulfate 325 (65 FE) MG tablet Take 325 mg by mouth.    . phentermine 37.5 MG capsule Take 1 capsule (37.5 mg total) by mouth every morning. 30 capsule 2  . valACYclovir (VALTREX) 1000 MG tablet Take 2 immediately and 2 pills 12 hours later and then as directed 20 tablet 0  . atorvastatin (LIPITOR) 80 MG tablet Take 40 mg by mouth.    .  fluocinonide-emollient (LIDEX-E) 0.05 % cream APPLY SPARINGLY TO AFFECTED AREAS TWICE DAILY  5  . fluconazole (DIFLUCAN) 150 MG tablet Take 1 tablet (150 mg total) by mouth daily. 5 tablet 0  . HYDROcodone-homatropine (HYCODAN) 5-1.5 MG/5ML syrup Take 5 mLs by mouth every 6 (six) hours as needed for cough. 120 mL 0  . levofloxacin (LEVAQUIN) 500 MG tablet Take 1 tablet (500 mg total) by mouth daily. 7 tablet 0   No facility-administered medications prior to visit.    ROS Review of Systems  Constitutional: Negative for fever, activity change and appetite change.  HENT: Negative for congestion, rhinorrhea and sore throat.   Eyes: Negative for visual disturbance.  Respiratory: Negative for cough and shortness of breath.   Cardiovascular: Negative for chest pain and palpitations.  Gastrointestinal: Negative for nausea, abdominal pain and diarrhea.  Genitourinary: Negative for dysuria.  Musculoskeletal: Negative for myalgias and arthralgias.    Objective:  BP 121/76 mmHg  Pulse 67  Temp(Src) 96.9 F (36.1 C) (Oral)  Ht 4' 11"  (1.499 m)  Wt 141 lb 9.6 oz (64.229 kg)  BMI 28.58 kg/m2  SpO2 98%  BP Readings from Last 3 Encounters:  06/16/15 121/76  03/14/15 134/93  01/02/15 138/87    Wt Readings from Last 3 Encounters:  06/16/15 141 lb 9.6 oz (64.229 kg)  03/14/15 132 lb (59.875 kg)  01/02/15 142 lb (64.411 kg)     Physical Exam  Constitutional: She is oriented to person, place, and time. She appears well-developed and well-nourished. No  distress.  HENT:  Head: Normocephalic and atraumatic.  Right Ear: External ear normal.  Left Ear: External ear normal.  Nose: Nose normal.  Mouth/Throat: Oropharynx is clear and moist.  Eyes: Conjunctivae and EOM are normal. Pupils are equal, round, and reactive to light.  Neck: Normal range of motion. Neck supple. No thyromegaly present.  Cardiovascular: Normal rate, regular rhythm and normal heart sounds.   No murmur  heard. Pulmonary/Chest: Effort normal and breath sounds normal. No respiratory distress. She has no wheezes. She has no rales.  Abdominal: Soft. Bowel sounds are normal. She exhibits no distension. There is no tenderness.  Lymphadenopathy:    She has no cervical adenopathy.  Neurological: She is alert and oriented to person, place, and time. She has normal reflexes.  Skin: Skin is warm and dry.  Psychiatric: She has a normal mood and affect. Her behavior is normal. Judgment and thought content normal.     Lab Results  Component Value Date   WBC 8.2 06/16/2015   HGB 14.5 12/13/2014   HCT 41.7 06/16/2015   PLT 247 06/16/2015   GLUCOSE 86 06/16/2015   CHOL 142 06/16/2015   TRIG 139 06/16/2015   HDL 39* 06/16/2015   LDLCALC 75 06/16/2015   ALT 25 06/16/2015   AST 22 06/16/2015   NA 142 06/16/2015   K 4.3 06/16/2015   CL 102 06/16/2015   CREATININE 0.88 06/16/2015   BUN 11 06/16/2015   CO2 23 06/16/2015   TSH 1.500 12/13/2014    Patient was never admitted.  Assessment & Plan:   Gina Alexander was seen today for hyperlipidemia.  Diagnoses and all orders for this visit:  Hyperlipidemia -     CMP14+EGFR -     Lipid panel  Asymptomatic HIV infection ([place]) -     CMP14+EGFR -     Lipid panel  Anemia, iron deficiency -     CBC with Differential/Platelet  Other orders -     fluocinonide-emollient (LIDEX-E) 0.05 % cream; APPLY SPARINGLY TO AFFECTED AREAS TWICE DAILY -     atorvastatin (LIPITOR) 80 MG tablet; Take 0.5 tablets (40 mg total) by mouth daily.   I have discontinued Ms. Dehne levofloxacin, HYDROcodone-homatropine, and fluconazole. I have also changed her atorvastatin. Additionally, I am having her maintain her aspirin, ferrous sulfate, abacavir-dolutegravir-lamiVUDine, clindamycin, valACYclovir, phentermine, docusate sodium, and fluocinonide-emollient.  Meds ordered this encounter  Medications  . docusate sodium (COLACE) 100 MG capsule    Sig: Take 100 mg by  mouth.  . fluocinonide-emollient (LIDEX-E) 0.05 % cream    Sig: APPLY SPARINGLY TO AFFECTED AREAS TWICE DAILY    Dispense:  30 g    Refill:  5  . atorvastatin (LIPITOR) 80 MG tablet    Sig: Take 0.5 tablets (40 mg total) by mouth daily.    Dispense:  30 tablet    Refill:  5     Follow-up: Return in about 6 months (around 12/14/2015) for cholesterol, CPE.  Claretta Fraise, M.D.

## 2015-06-17 LAB — CBC WITH DIFFERENTIAL/PLATELET
BASOS ABS: 0 10*3/uL (ref 0.0–0.2)
BASOS: 1 %
EOS (ABSOLUTE): 0.1 10*3/uL (ref 0.0–0.4)
Eos: 2 %
Hematocrit: 41.7 % (ref 34.0–46.6)
Hemoglobin: 14.5 g/dL (ref 11.1–15.9)
Immature Grans (Abs): 0 10*3/uL (ref 0.0–0.1)
Immature Granulocytes: 0 %
LYMPHS: 34 %
Lymphocytes Absolute: 2.8 10*3/uL (ref 0.7–3.1)
MCH: 31.9 pg (ref 26.6–33.0)
MCHC: 34.8 g/dL (ref 31.5–35.7)
MCV: 92 fL (ref 79–97)
MONOS ABS: 0.7 10*3/uL (ref 0.1–0.9)
Monocytes: 8 %
NEUTROS ABS: 4.5 10*3/uL (ref 1.4–7.0)
Neutrophils: 55 %
PLATELETS: 247 10*3/uL (ref 150–379)
RBC: 4.54 x10E6/uL (ref 3.77–5.28)
RDW: 13.4 % (ref 12.3–15.4)
WBC: 8.2 10*3/uL (ref 3.4–10.8)

## 2015-06-17 LAB — LIPID PANEL
CHOL/HDL RATIO: 3.6 ratio (ref 0.0–4.4)
Cholesterol, Total: 142 mg/dL (ref 100–199)
HDL: 39 mg/dL — AB (ref >39)
LDL Calculated: 75 mg/dL (ref 0–99)
Triglycerides: 139 mg/dL (ref 0–149)
VLDL Cholesterol Cal: 28 mg/dL (ref 5–40)

## 2015-06-17 LAB — CMP14+EGFR
A/G RATIO: 1.7 (ref 1.1–2.5)
ALT: 25 IU/L (ref 0–32)
AST: 22 IU/L (ref 0–40)
Albumin: 4.3 g/dL (ref 3.5–5.5)
Alkaline Phosphatase: 88 IU/L (ref 39–117)
BILIRUBIN TOTAL: 0.3 mg/dL (ref 0.0–1.2)
BUN/Creatinine Ratio: 13 (ref 9–23)
BUN: 11 mg/dL (ref 6–24)
CO2: 23 mmol/L (ref 18–29)
Calcium: 9.4 mg/dL (ref 8.7–10.2)
Chloride: 102 mmol/L (ref 96–106)
Creatinine, Ser: 0.88 mg/dL (ref 0.57–1.00)
GFR, EST AFRICAN AMERICAN: 93 mL/min/{1.73_m2} (ref >59)
GFR, EST NON AFRICAN AMERICAN: 81 mL/min/{1.73_m2} (ref >59)
GLUCOSE: 86 mg/dL (ref 65–99)
Globulin, Total: 2.6 g/dL (ref 1.5–4.5)
POTASSIUM: 4.3 mmol/L (ref 3.5–5.2)
SODIUM: 142 mmol/L (ref 134–144)
Total Protein: 6.9 g/dL (ref 6.0–8.5)

## 2015-06-28 ENCOUNTER — Encounter: Payer: Self-pay | Admitting: Family Medicine

## 2015-06-28 ENCOUNTER — Ambulatory Visit (INDEPENDENT_AMBULATORY_CARE_PROVIDER_SITE_OTHER): Payer: Medicare Other | Admitting: Family Medicine

## 2015-06-28 VITALS — BP 130/79 | HR 71 | Temp 97.3°F | Ht 59.0 in | Wt 137.6 lb

## 2015-06-28 DIAGNOSIS — R0989 Other specified symptoms and signs involving the circulatory and respiratory systems: Secondary | ICD-10-CM | POA: Diagnosis not present

## 2015-06-28 DIAGNOSIS — J069 Acute upper respiratory infection, unspecified: Secondary | ICD-10-CM | POA: Diagnosis not present

## 2015-06-28 DIAGNOSIS — R52 Pain, unspecified: Secondary | ICD-10-CM | POA: Diagnosis not present

## 2015-06-28 LAB — POCT INFLUENZA A/B
Influenza A, POC: NEGATIVE
Influenza B, POC: NEGATIVE

## 2015-06-28 NOTE — Progress Notes (Signed)
Subjective:  Patient ID: Gina Alexander, female    DOB: 1971/05/14  Age: 45 y.o. MRN: [id number]  CC: URI   HPI Caffie Sotto presents for Patient presents with upper respiratory congestion. Rhinorrhea that is frequently purulent. There is moderate sore throat.Sinus presure, body aches. Patient reports coughing frequently as well.dry. Causing hoarseness.  There is no fever no chills no sweats. The patient denies being short of breath. Onset was 3 days ago. Gradually worsening in spite of home remedies.Using Dayquil & nyquil.    History Kaleeyah has a past medical history of Myocardial infarction (HCC); Hyperlipidemia; and HIV infection (HCC).   She has past surgical history that includes Cholecystectomy; Tubal ligation (2006); uterine ablation; and Breast enhancement surgery.   Her family history includes Arthritis in her mother; COPD in her mother.She reports that she has quit smoking. She quit smokeless tobacco use about 17 months ago. She reports that she drinks about 1.2 oz of alcohol per week. She reports that she does not use illicit drugs.  Current Outpatient Prescriptions on File Prior to Visit  Medication Sig Dispense Refill  . Abacavir-Dolutegravir-Lamivud (TRIUMEQ) 600-50-300 MG TABS Take 1 tablet by mouth daily.     Marland Kitchen aspirin 81 MG chewable tablet Chew 81 mg by mouth.    Marland Kitchen atorvastatin (LIPITOR) 80 MG tablet Take 0.5 tablets (40 mg total) by mouth daily. 30 tablet 5  . docusate sodium (COLACE) 100 MG capsule Take 100 mg by mouth.    . ferrous sulfate 325 (65 FE) MG tablet Take 325 mg by mouth.    . fluocinonide-emollient (LIDEX-E) 0.05 % cream APPLY SPARINGLY TO AFFECTED AREAS TWICE DAILY 30 g 5  . phentermine 37.5 MG capsule Take 1 capsule (37.5 mg total) by mouth every morning. 30 capsule 2  . valACYclovir (VALTREX) 1000 MG tablet Take 2 immediately and 2 pills 12 hours later and then as directed 20 tablet 0   No current facility-administered medications on file prior  to visit.    ROS Review of Systems  Constitutional: Negative for fever, chills, activity change and appetite change.  HENT: Positive for congestion, postnasal drip, rhinorrhea, sinus pressure and voice change (hoarse). Negative for ear discharge, ear pain, hearing loss, nosebleeds, sneezing and trouble swallowing.   Respiratory: Negative for chest tightness and shortness of breath.   Cardiovascular: Negative for chest pain and palpitations.  Skin: Negative for rash.    Objective:  BP 130/79 mmHg  Pulse 71  Temp(Src) 97.3 F (36.3 C) (Oral)  Ht  (1.499 m)  Wt 137 lb 9.6 oz (62.415 kg)  BMI 27.78 kg/m2  SpO2 97%  Physical Exam  Constitutional: She appears well-developed and well-nourished.  HENT:  Head: Normocephalic and atraumatic.  Right Ear: Tympanic membrane and external ear normal. No decreased hearing is noted.  Left Ear: Tympanic membrane and external ear normal. No decreased hearing is noted.  Nose: Mucosal edema present. Right sinus exhibits no frontal sinus tenderness. Left sinus exhibits no frontal sinus tenderness.  Mouth/Throat: Posterior oropharyngeal edema and posterior oropharyngeal erythema present. No oropharyngeal exudate.  Neck: Normal range of motion. Neck supple. No Brudzinski's sign noted.  Pulmonary/Chest: Breath sounds normal. No respiratory distress.  Lymphadenopathy:       Head (right side): No preauricular adenopathy present.       Head (left side): No preauricular adenopathy present.       Right cervical: No superficial cervical adenopathy present.      Left cervical: No superficial cervical adenopathy present.  Assessment & Plan:   Koralyn was seen today for uri.  Diagnoses and all orders for this visit:  Body aches -     POCT Influenza A/B  Chest congestion -     POCT Influenza A/B  Acute upper respiratory infection   I have discontinued Ms. Cuadra clindamycin. I am also having her maintain her aspirin, ferrous sulfate,  abacavir-dolutegravir-lamiVUDine, valACYclovir, phentermine, docusate sodium, fluocinonide-emollient, and atorvastatin.  No orders of the defined types were placed in this encounter.     Follow-up: Return if symptoms worsen or fail to improve.  Mechele Claude, M.D.

## 2015-09-18 DIAGNOSIS — I2510 Atherosclerotic heart disease of native coronary artery without angina pectoris: Secondary | ICD-10-CM | POA: Diagnosis not present

## 2015-09-18 DIAGNOSIS — B20 Human immunodeficiency virus [HIV] disease: Secondary | ICD-10-CM | POA: Diagnosis not present

## 2015-09-18 DIAGNOSIS — Z006 Encounter for examination for normal comparison and control in clinical research program: Secondary | ICD-10-CM | POA: Diagnosis not present

## 2015-09-18 DIAGNOSIS — R85610 Atypical squamous cells of undetermined significance on cytologic smear of anus (ASC-US): Secondary | ICD-10-CM | POA: Diagnosis not present

## 2015-12-08 ENCOUNTER — Other Ambulatory Visit: Payer: Self-pay | Admitting: Family Medicine

## 2015-12-20 DIAGNOSIS — E611 Iron deficiency: Secondary | ICD-10-CM | POA: Diagnosis not present

## 2015-12-20 DIAGNOSIS — R252 Cramp and spasm: Secondary | ICD-10-CM | POA: Diagnosis not present

## 2015-12-20 DIAGNOSIS — Z79899 Other long term (current) drug therapy: Secondary | ICD-10-CM | POA: Diagnosis not present

## 2015-12-20 DIAGNOSIS — Z7189 Other specified counseling: Secondary | ICD-10-CM | POA: Diagnosis not present

## 2015-12-20 DIAGNOSIS — I2510 Atherosclerotic heart disease of native coronary artery without angina pectoris: Secondary | ICD-10-CM | POA: Diagnosis not present

## 2015-12-20 DIAGNOSIS — N921 Excessive and frequent menstruation with irregular cycle: Secondary | ICD-10-CM | POA: Diagnosis not present

## 2015-12-20 DIAGNOSIS — G2581 Restless legs syndrome: Secondary | ICD-10-CM | POA: Diagnosis not present

## 2015-12-20 DIAGNOSIS — B20 Human immunodeficiency virus [HIV] disease: Secondary | ICD-10-CM | POA: Diagnosis not present

## 2015-12-20 DIAGNOSIS — R030 Elevated blood-pressure reading, without diagnosis of hypertension: Secondary | ICD-10-CM | POA: Diagnosis not present

## 2015-12-20 DIAGNOSIS — Z87891 Personal history of nicotine dependence: Secondary | ICD-10-CM | POA: Diagnosis not present

## 2016-01-29 DIAGNOSIS — Z793 Long term (current) use of hormonal contraceptives: Secondary | ICD-10-CM | POA: Diagnosis not present

## 2016-01-29 DIAGNOSIS — D649 Anemia, unspecified: Secondary | ICD-10-CM | POA: Diagnosis not present

## 2016-01-29 DIAGNOSIS — I252 Old myocardial infarction: Secondary | ICD-10-CM | POA: Diagnosis not present

## 2016-01-29 DIAGNOSIS — Z7982 Long term (current) use of aspirin: Secondary | ICD-10-CM | POA: Diagnosis not present

## 2016-01-29 DIAGNOSIS — B20 Human immunodeficiency virus [HIV] disease: Secondary | ICD-10-CM | POA: Diagnosis not present

## 2016-01-29 DIAGNOSIS — Z882 Allergy status to sulfonamides status: Secondary | ICD-10-CM | POA: Diagnosis not present

## 2016-01-29 DIAGNOSIS — Z9049 Acquired absence of other specified parts of digestive tract: Secondary | ICD-10-CM | POA: Diagnosis not present

## 2016-01-29 DIAGNOSIS — Z87891 Personal history of nicotine dependence: Secondary | ICD-10-CM | POA: Diagnosis not present

## 2016-01-29 DIAGNOSIS — T7621XA Adult sexual abuse, suspected, initial encounter: Secondary | ICD-10-CM | POA: Diagnosis not present

## 2016-01-29 DIAGNOSIS — E785 Hyperlipidemia, unspecified: Secondary | ICD-10-CM | POA: Diagnosis not present

## 2016-01-29 DIAGNOSIS — Z79899 Other long term (current) drug therapy: Secondary | ICD-10-CM | POA: Diagnosis not present

## 2016-01-29 DIAGNOSIS — Z888 Allergy status to other drugs, medicaments and biological substances status: Secondary | ICD-10-CM | POA: Diagnosis not present

## 2016-02-01 ENCOUNTER — Other Ambulatory Visit: Payer: Self-pay | Admitting: Pediatrics

## 2016-02-01 ENCOUNTER — Telehealth: Payer: Self-pay | Admitting: Family Medicine

## 2016-02-01 MED ORDER — FLUCONAZOLE 150 MG PO TABS: 150.0000 mg | ORAL_TABLET | ORAL | 0 refills | Status: DC | PRN

## 2016-02-01 NOTE — Telephone Encounter (Signed)
Pt aware.

## 2016-02-01 NOTE — Telephone Encounter (Signed)
Rx sent in

## 2016-02-07 ENCOUNTER — Encounter: Payer: Self-pay | Admitting: Family Medicine

## 2016-02-28 ENCOUNTER — Encounter: Payer: Self-pay | Admitting: Family Medicine

## 2016-02-28 ENCOUNTER — Ambulatory Visit (INDEPENDENT_AMBULATORY_CARE_PROVIDER_SITE_OTHER): Payer: Medicare Other | Admitting: Family Medicine

## 2016-02-28 VITALS — BP 141/93 | HR 60 | Temp 97.7°F | Ht 59.0 in | Wt 128.4 lb

## 2016-02-28 DIAGNOSIS — F329 Major depressive disorder, single episode, unspecified: Secondary | ICD-10-CM

## 2016-02-28 DIAGNOSIS — E785 Hyperlipidemia, unspecified: Secondary | ICD-10-CM | POA: Diagnosis not present

## 2016-02-28 DIAGNOSIS — I10 Essential (primary) hypertension: Secondary | ICD-10-CM

## 2016-02-28 DIAGNOSIS — F32A Depression, unspecified: Secondary | ICD-10-CM | POA: Insufficient documentation

## 2016-02-28 MED ORDER — AMLODIPINE BESYLATE 5 MG PO TABS: 5.0000 mg | ORAL_TABLET | Freq: Every day | ORAL | 5 refills | Status: DC

## 2016-02-28 MED ORDER — SERTRALINE HCL 100 MG PO TABS: 100.0000 mg | ORAL_TABLET | Freq: Every day | ORAL | 3 refills | Status: DC

## 2016-02-28 NOTE — Progress Notes (Signed)
Subjective:  Patient ID: Gina Alexander, female    DOB: 1971/01/22  Age: 45 y.o. MRN: [id number]  CC: Hypertension   HPI Shakeisha Horine presents for  follow-up of hypertension. Patient has history of moderate headache but no chest pain or shortness of breath or recent cough. Patient also denies symptoms of TIA such as numbness weakness lateralizing. Patient checks  blood pressure at home and has had several elevated readings recently. Two yesterday 179/113 & 165/111. Patient denies side effects from medication. States taking it regularly.  HA - located at crown bilateral . 4/10 throb. Phono&photophobia.  Yesterday  Pt experienced LUQ pain 7/10 all dayIt was a pressure at left costal margin, anterior axillary line.  She is here today with her husband from whom she has been separated. They talked yesterday for the first time sinc ethe separation even though they have had two court appearances.   Depression screen [facility] 2/9 02/28/2016 03/14/2015 01/02/2015 12/13/2014  Decreased Interest 3 0 0 0  Down, Depressed, Hopeless 3 0 0 1  PHQ - 2 Score 6 0 0 1  Altered sleeping 3 - - -  Tired, decreased energy 3 - - -  Change in appetite 3 - - -  Feeling bad or failure about yourself  3 - - -  Trouble concentrating 3 - - -  Moving slowly or fidgety/restless 1 - - -  Suicidal thoughts 1 - - -  PHQ-9 Score 23 - - -  Difficult doing work/chores Somewhat difficult - - -     History Brieann has a past medical history of HIV infection ([place]); Hyperlipidemia; and Myocardial infarction ([place]).   She has a past surgical history that includes Cholecystectomy; Tubal ligation (2006); uterine ablation; and Breast enhancement surgery.   Her family history includes Arthritis in her mother; COPD in her mother.She reports that she has quit smoking. She quit smokeless tobacco use about 2 years ago. She reports that she drinks about 1.2 oz of alcohol per week . She reports that she does not use drugs.  Current  Outpatient Prescriptions on File Prior to Visit  Medication Sig Dispense Refill  . Abacavir-Dolutegravir-Lamivud (TRIUMEQ) 600-50-300 MG TABS Take 1 tablet by mouth daily.     Marland Kitchen atorvastatin (LIPITOR) 80 MG tablet Take 0.5 tablets (40 mg total) by mouth daily. 30 tablet 5  . aspirin 81 MG chewable tablet Chew 81 mg by mouth.    . docusate sodium (COLACE) 100 MG capsule Take 100 mg by mouth.    . ferrous sulfate 325 (65 FE) MG tablet Take 325 mg by mouth.    . fluocinonide-emollient (LIDEX-E) 0.05 % cream APPLY SPARINGLY TO AFFECTED AREAS TWICE DAILY (Patient not taking: Reported on 02/28/2016) 30 g 5  . valACYclovir (VALTREX) 1000 MG tablet Take 2 immediately and 2 pills 12 hours later and then as directed (Patient not taking: Reported on 02/28/2016) 20 tablet 0   No current facility-administered medications on file prior to visit.     ROS Review of Systems  Constitutional: Negative for activity change, appetite change and fever.  HENT: Negative for congestion, rhinorrhea and sore throat.   Eyes: Negative for visual disturbance.  Respiratory: Negative for cough and shortness of breath.   Cardiovascular: Negative for chest pain and palpitations.  Gastrointestinal: Negative for abdominal pain, diarrhea and nausea.  Genitourinary: Negative for dysuria.  Musculoskeletal: Negative for arthralgias and myalgias.    Objective:  BP (!) 141/93   Pulse 60   Temp 97.7 F (36.5  C) (Oral)   Ht _0  (1.499 m)   Wt 128 lb 6.4 oz (58.2 kg)   BMI 25.93 kg/m   BP Readings from Last 3 Encounters:  02/28/16 (!) 141/93  06/28/15 130/79  06/16/15 121/76    Wt Readings from Last 3 Encounters:  02/28/16 128 lb 6.4 oz (58.2 kg)  06/28/15 137 lb 9.6 oz (62.4 kg)  06/16/15 141 lb 9.6 oz (64.2 kg)     Physical Exam  Constitutional: She is oriented to person, place, and time. She appears well-developed and well-nourished. No distress.  HENT:  Head: Normocephalic and atraumatic.  Right Ear:  External ear normal.  Left Ear: External ear normal.  Nose: Nose normal.  Mouth/Throat: Oropharynx is clear and moist.  Eyes: Conjunctivae and EOM are normal. Pupils are equal, round, and reactive to light.  Neck: Normal range of motion. Neck supple. No thyromegaly present.  Cardiovascular: Normal rate, regular rhythm and normal heart sounds.   No murmur heard. Pulmonary/Chest: Effort normal and breath sounds normal. No respiratory distress. She has no wheezes. She has no rales.  Abdominal: Soft. Bowel sounds are normal. She exhibits no distension. There is no tenderness.  Lymphadenopathy:    She has no cervical adenopathy.  Neurological: She is alert and oriented to person, place, and time. She has normal reflexes.  Skin: Skin is warm and dry.  Psychiatric: Thought content normal. Her mood appears anxious. Her affect is labile. Her speech is delayed. She is withdrawn. Cognition and memory are normal.     Lab Results  Component Value Date   WBC 8.2 06/16/2015   HGB 14.5 12/13/2014   HCT 41.7 06/16/2015   PLT 247 06/16/2015   GLUCOSE 86 06/16/2015   CHOL 142 06/16/2015   TRIG 139 06/16/2015   HDL 39 (L) 06/16/2015   LDLCALC 75 06/16/2015   ALT 25 06/16/2015   AST 22 06/16/2015   NA 142 06/16/2015   K 4.3 06/16/2015   CL 102 06/16/2015   CREATININE 0.88 06/16/2015   BUN 11 06/16/2015   CO2 23 06/16/2015   TSH 1.500 12/13/2014    Patient was never admitted.  Assessment & Plan:   Naraly was seen today for hypertension.  Diagnoses and all orders for this visit:  Hyperlipidemia -     CBC with Differential/Platelet -     CMP14+EGFR -     Lipid panel  Essential hypertension -     CBC with Differential/Platelet -     CMP14+EGFR  Depression -     CBC with Differential/Platelet -     CMP14+EGFR  Other orders -     sertraline (ZOLOFT) 100 MG tablet; Take 1 tablet (100 mg total) by mouth daily. For depression -     amLODipine (NORVASC) 5 MG tablet; Take 1 tablet (5  mg total) by mouth daily. For blood pressure   I have discontinued Ms. Luepke phentermine and fluconazole. I am also having her start on sertraline and amLODipine. Additionally, I am having her maintain her aspirin, ferrous sulfate, abacavir-dolutegravir-lamiVUDine, valACYclovir, docusate sodium, fluocinonide-emollient, atorvastatin, and traMADol.  Meds ordered this encounter  Medications  . traMADol (ULTRAM) 50 MG tablet    Sig: Take 50 mg by mouth.  . sertraline (ZOLOFT) 100 MG tablet    Sig: Take 1 tablet (100 mg total) by mouth daily. For depression    Dispense:  30 tablet    Refill:  3  . amLODipine (NORVASC) 5 MG tablet    Sig: Take 1  tablet (5 mg total) by mouth daily. For blood pressure    Dispense:  30 tablet    Refill:  5   Follow-up: Return in about 2 weeks (around 03/13/2016) for Depression, hypertension.  Claretta Fraise, M.D.

## 2016-02-29 LAB — CBC WITH DIFFERENTIAL/PLATELET
BASOS ABS: 0 10*3/uL (ref 0.0–0.2)
Basos: 0 %
EOS (ABSOLUTE): 0.1 10*3/uL (ref 0.0–0.4)
EOS: 1 %
HEMATOCRIT: 42.7 % (ref 34.0–46.6)
HEMOGLOBIN: 14.8 g/dL (ref 11.1–15.9)
IMMATURE GRANS (ABS): 0 10*3/uL (ref 0.0–0.1)
IMMATURE GRANULOCYTES: 0 %
LYMPHS ABS: 2.6 10*3/uL (ref 0.7–3.1)
LYMPHS: 27 %
MCH: 32.2 pg (ref 26.6–33.0)
MCHC: 34.7 g/dL (ref 31.5–35.7)
MCV: 93 fL (ref 79–97)
MONOCYTES: 8 %
Monocytes Absolute: 0.7 10*3/uL (ref 0.1–0.9)
Neutrophils Absolute: 6.2 10*3/uL (ref 1.4–7.0)
Neutrophils: 64 %
Platelets: 220 10*3/uL (ref 150–379)
RBC: 4.59 x10E6/uL (ref 3.77–5.28)
RDW: 13.3 % (ref 12.3–15.4)
WBC: 9.6 10*3/uL (ref 3.4–10.8)

## 2016-02-29 LAB — CMP14+EGFR
ALBUMIN: 4.6 g/dL (ref 3.5–5.5)
ALT: 22 IU/L (ref 0–32)
AST: 17 IU/L (ref 0–40)
Albumin/Globulin Ratio: 1.4 (ref 1.2–2.2)
Alkaline Phosphatase: 82 IU/L (ref 39–117)
BUN / CREAT RATIO: 11 (ref 9–23)
BUN: 11 mg/dL (ref 6–24)
Bilirubin Total: 0.9 mg/dL (ref 0.0–1.2)
CALCIUM: 9.9 mg/dL (ref 8.7–10.2)
CO2: 25 mmol/L (ref 18–29)
CREATININE: 1 mg/dL (ref 0.57–1.00)
Chloride: 101 mmol/L (ref 96–106)
GFR calc Af Amer: 80 mL/min/{1.73_m2} (ref >59)
GFR, EST NON AFRICAN AMERICAN: 69 mL/min/{1.73_m2} (ref >59)
GLOBULIN, TOTAL: 3.2 g/dL (ref 1.5–4.5)
GLUCOSE: 114 mg/dL — AB (ref 65–99)
Potassium: 3.9 mmol/L (ref 3.5–5.2)
SODIUM: 143 mmol/L (ref 134–144)
TOTAL PROTEIN: 7.8 g/dL (ref 6.0–8.5)

## 2016-02-29 LAB — LIPID PANEL
CHOL/HDL RATIO: 3.7 ratio (ref 0.0–4.4)
CHOLESTEROL TOTAL: 122 mg/dL (ref 100–199)
HDL: 33 mg/dL — ABNORMAL LOW (ref >39)
LDL CALC: 70 mg/dL (ref 0–99)
Triglycerides: 94 mg/dL (ref 0–149)
VLDL CHOLESTEROL CAL: 19 mg/dL (ref 5–40)

## 2016-03-06 IMAGING — CR DG CHEST 2V
2 series · 2 of 2 positions shown · non-contrast
Comparison: None.

CLINICAL DATA: Tobacco use, screening.

EXAM:
CHEST  2 VIEW

[view not recorded (1 of 2)]
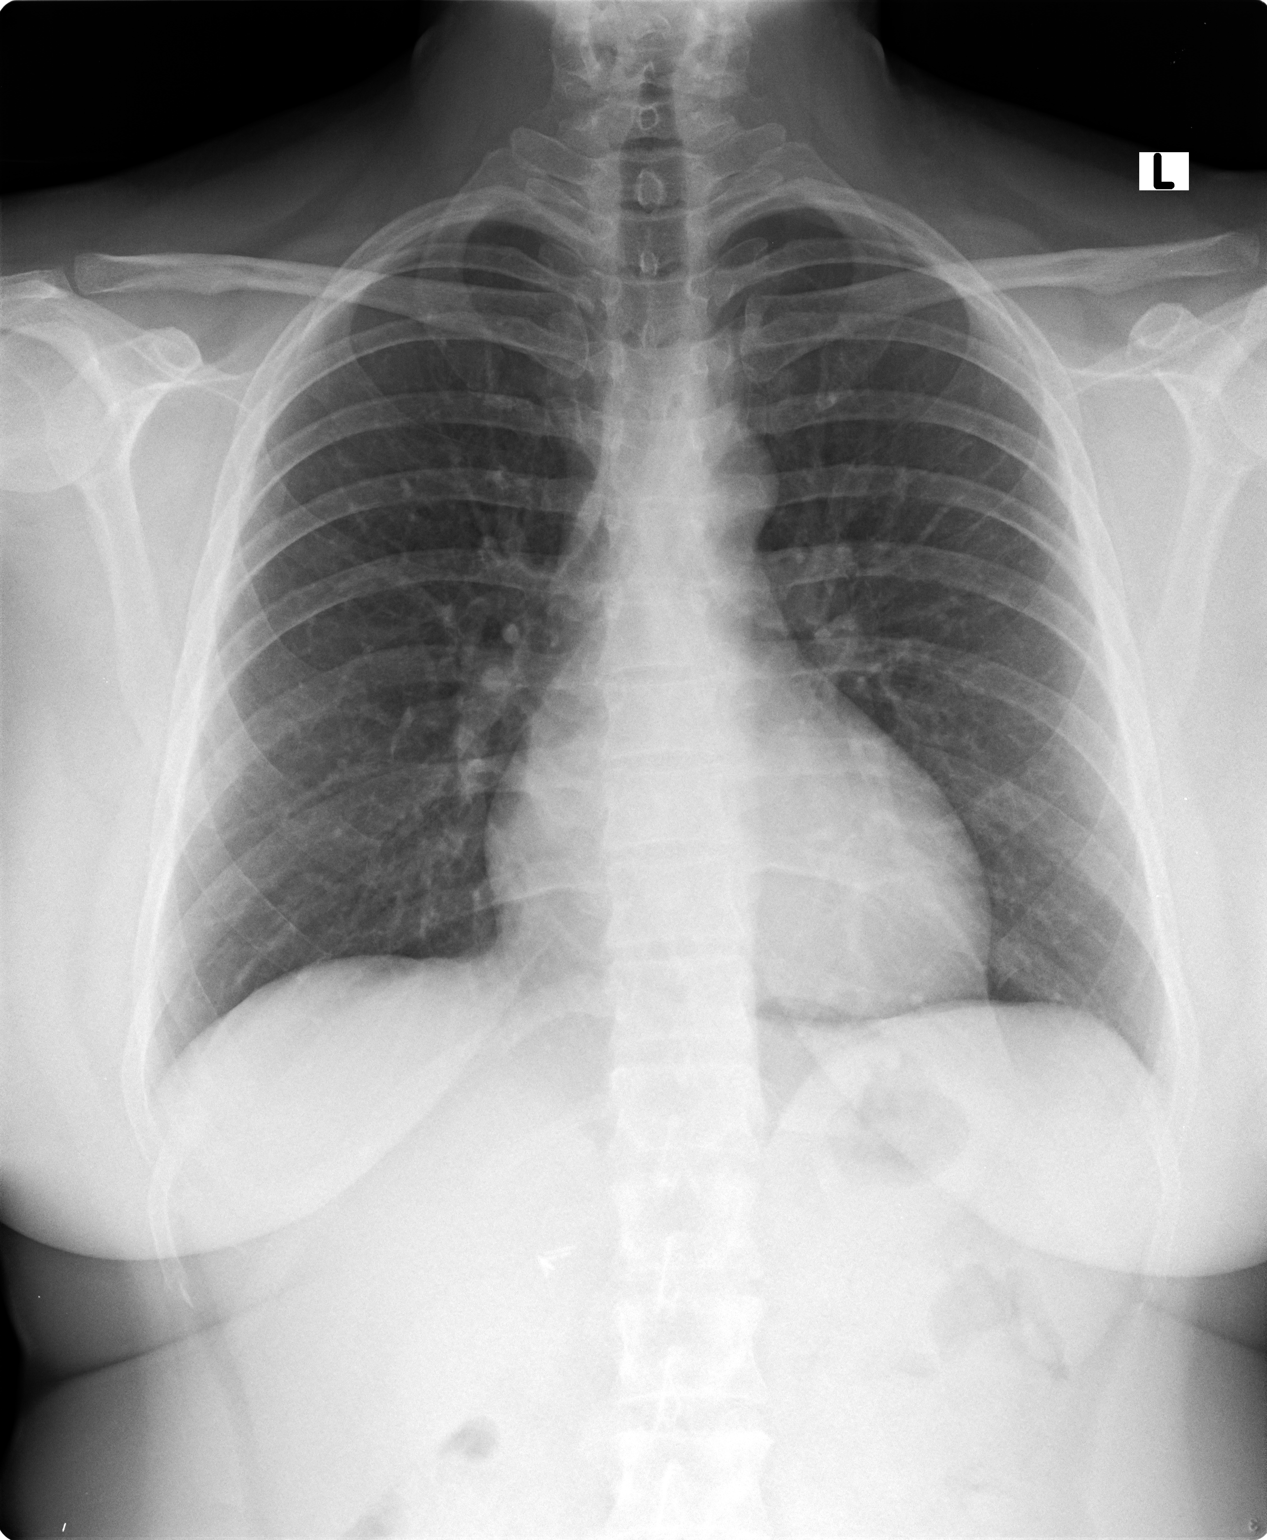

[view not recorded (2 of 2)]
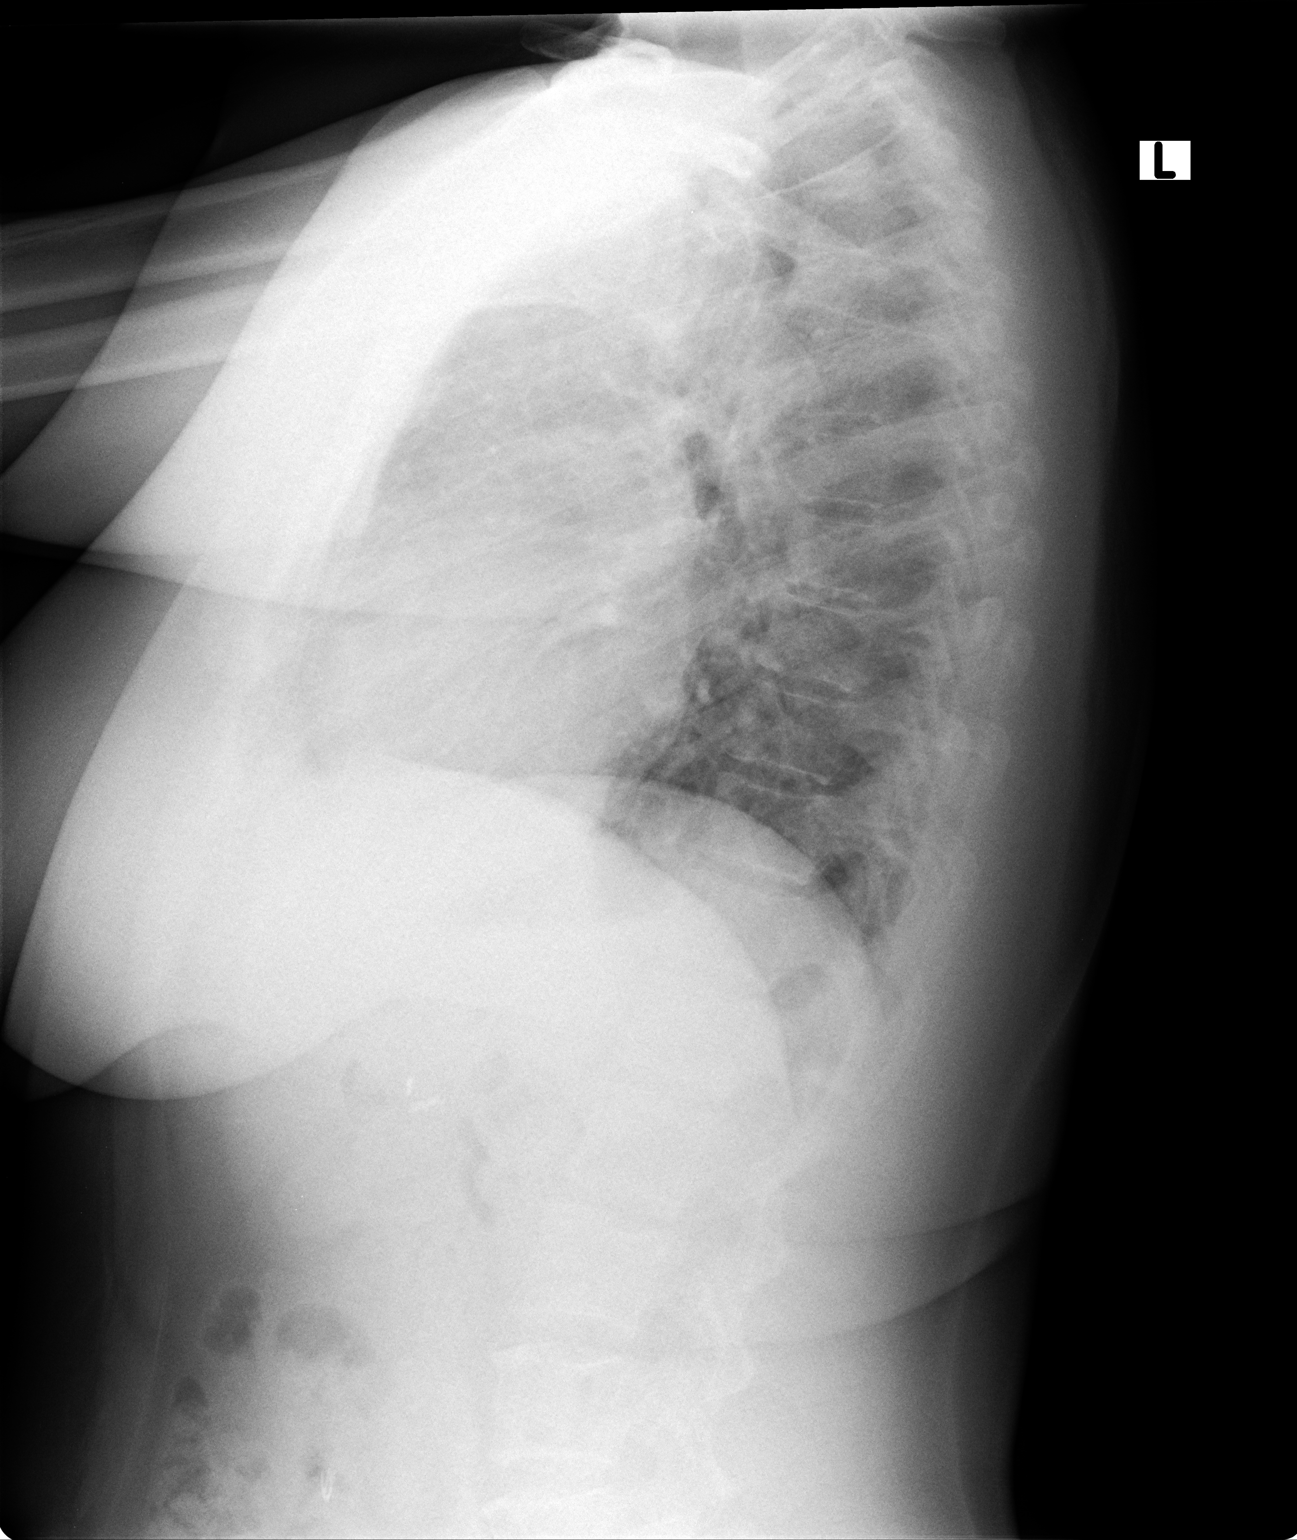

[2 of 2 positions shown; findings below may reference images not displayed]

FINDINGS: The heart size and mediastinal contours are within normal limits.
Both lungs are clear. No pneumothorax or pleural effusion is noted.
The visualized skeletal structures are unremarkable.
IMPRESSION: No active cardiopulmonary disease.

## 2016-03-11 ENCOUNTER — Encounter: Payer: Self-pay | Admitting: Family Medicine

## 2016-03-11 ENCOUNTER — Ambulatory Visit (INDEPENDENT_AMBULATORY_CARE_PROVIDER_SITE_OTHER): Payer: Medicare Other | Admitting: Family Medicine

## 2016-03-11 VITALS — BP 133/86 | HR 58 | Temp 97.4°F | Ht 59.0 in | Wt 130.4 lb

## 2016-03-11 DIAGNOSIS — Z23 Encounter for immunization: Secondary | ICD-10-CM | POA: Diagnosis not present

## 2016-03-11 DIAGNOSIS — I10 Essential (primary) hypertension: Secondary | ICD-10-CM | POA: Diagnosis not present

## 2016-03-11 DIAGNOSIS — G44209 Tension-type headache, unspecified, not intractable: Secondary | ICD-10-CM

## 2016-03-11 DIAGNOSIS — R300 Dysuria: Secondary | ICD-10-CM

## 2016-03-11 LAB — MICROSCOPIC EXAMINATION: WBC, UA: >30 /hpf — AB (ref 0–?)

## 2016-03-11 LAB — URINALYSIS, COMPLETE
BILIRUBIN UA: NEGATIVE
GLUCOSE, UA: NEGATIVE
NITRITE UA: POSITIVE — AB
Specific Gravity, UA: >1.03 — ABNORMAL HIGH (ref 1.005–1.030)
Urobilinogen, Ur: 0.2 mg/dL (ref 0.2–1.0)
pH, UA: 5 (ref 5.0–7.5)
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
## 2016-03-12 ENCOUNTER — Ambulatory Visit (INDEPENDENT_AMBULATORY_CARE_PROVIDER_SITE_OTHER): Payer: Medicare Other | Admitting: Family Medicine

## 2016-03-12 ENCOUNTER — Encounter: Payer: Self-pay | Admitting: Family Medicine

## 2016-03-12 VITALS — BP 128/78 | HR 57 | Temp 97.4°F | Ht 59.0 in | Wt 130.4 lb

## 2016-03-12 DIAGNOSIS — Z21 Asymptomatic human immunodeficiency virus [HIV] infection status: Secondary | ICD-10-CM | POA: Diagnosis not present

## 2016-03-12 DIAGNOSIS — I10 Essential (primary) hypertension: Secondary | ICD-10-CM | POA: Diagnosis not present

## 2016-03-12 DIAGNOSIS — N3090 Cystitis, unspecified without hematuria: Secondary | ICD-10-CM

## 2016-03-12 DIAGNOSIS — B373 Candidiasis of vulva and vagina: Secondary | ICD-10-CM | POA: Diagnosis not present

## 2016-03-12 DIAGNOSIS — B3731 Acute candidiasis of vulva and vagina: Secondary | ICD-10-CM

## 2016-03-12 DIAGNOSIS — Z01411 Encounter for gynecological examination (general) (routine) with abnormal findings: Secondary | ICD-10-CM | POA: Diagnosis not present

## 2016-03-12 DIAGNOSIS — Z124 Encounter for screening for malignant neoplasm of cervix: Secondary | ICD-10-CM | POA: Diagnosis not present

## 2016-03-12 DIAGNOSIS — E782 Mixed hyperlipidemia: Secondary | ICD-10-CM

## 2016-03-12 MED ORDER — CIPROFLOXACIN HCL 500 MG PO TABS: 500.0000 mg | ORAL_TABLET | Freq: Two times a day (BID) | ORAL | 0 refills | Status: DC

## 2016-03-12 MED ORDER — FLUCONAZOLE 100 MG PO TABS: 100.0000 mg | ORAL_TABLET | Freq: Every day | ORAL | 0 refills | Status: DC

## 2016-03-12 NOTE — Progress Notes (Signed)
Subjective:  Patient ID: Gina Alexander, female    DOB: 03-07-1971  Age: 45 y.o. MRN: [id number]  CC: Gynecologic Exam (pt here for pap smear )   HPI Gina Alexander presents for Gynecological evaluation. She is HIV positive. She has been having some UTI symptoms. Primarily tickling and mild discharge. Also some frequency.   follow-up of hypertension. Patient has no history of headache chest pain or shortness of breath or recent cough. Patient also denies symptoms of TIA such as numbness weakness lateralizing. Patient checks  blood pressure at home and has not had any elevated readings recently. Patient denies side effects from medication. States taking it regularly.    History Gina Alexander has a past medical history of HIV infection ([place]); Hyperlipidemia; and Myocardial infarction.   She has a past surgical history that includes Cholecystectomy; Tubal ligation (2006); uterine ablation; and Breast enhancement surgery.   Her family history includes Arthritis in her mother; COPD in her mother.She reports that she has quit smoking. She quit smokeless tobacco use about 2 years ago. She reports that she drinks about 1.2 oz of alcohol per week . She reports that she does not use drugs.    ROS Review of Systems  Constitutional: Negative for appetite change, chills, diaphoresis, fatigue, fever and unexpected weight change.  HENT: Negative for congestion, ear pain, hearing loss, postnasal drip, rhinorrhea, sneezing, sore throat and trouble swallowing.   Eyes: Negative for pain.  Respiratory: Negative for cough, chest tightness and shortness of breath.   Cardiovascular: Negative for chest pain and palpitations.  Gastrointestinal: Negative for abdominal pain, constipation, diarrhea, nausea and vomiting.  Endocrine: Negative for cold intolerance, heat intolerance, polydipsia, polyphagia and polyuria.  Genitourinary: Negative for dysuria, frequency and menstrual problem.  Musculoskeletal: Negative  for arthralgias and joint swelling.  Skin: Negative for rash.  Allergic/Immunologic: Negative for environmental allergies.  Neurological: Negative for dizziness, weakness, numbness and headaches.  Psychiatric/Behavioral: Negative for agitation and dysphoric mood.    Objective:  BP 128/78   Pulse (!) 57   Temp 97.4 F (36.3 C) (Oral)   Ht 4' 11"  (1.499 m)   Wt 130 lb 6 oz (59.1 kg)   BMI 26.33 kg/m   BP Readings from Last 3 Encounters:  03/12/16 128/78  03/11/16 133/86  02/28/16 (!) 141/93    Wt Readings from Last 3 Encounters:  03/12/16 130 lb 6 oz (59.1 kg)  03/11/16 130 lb 6 oz (59.1 kg)  02/28/16 128 lb 6.4 oz (58.2 kg)     Physical Exam  Constitutional: She is oriented to person, place, and time. She appears well-developed and well-nourished. No distress.  HENT:  Head: Normocephalic and atraumatic.  Right Ear: External ear normal.  Left Ear: External ear normal.  Nose: Nose normal.  Mouth/Throat: Oropharynx is clear and moist.  Eyes: Conjunctivae and EOM are normal. Pupils are equal, round, and reactive to light.  Neck: Normal range of motion. Neck supple. No thyromegaly present.  Cardiovascular: Normal rate, regular rhythm and normal heart sounds.   No murmur heard. Pulmonary/Chest: Effort normal and breath sounds normal. No respiratory distress. She has no wheezes. She has no rales. Right breast exhibits no inverted nipple, no mass and no tenderness. Left breast exhibits no inverted nipple, no mass and no tenderness. Breasts are symmetrical.  Abdominal: Soft. Normal appearance and bowel sounds are normal. She exhibits no distension, no abdominal bruit and no mass. There is no splenomegaly or hepatomegaly. There is no tenderness. There is no tenderness at McBurney's  point and negative Murphy's sign. Hernia confirmed negative in the right inguinal area and confirmed negative in the left inguinal area.  Genitourinary: Rectal exam shows no external hemorrhoid. No breast  swelling or tenderness. There is no rash or tenderness on the right labia. There is no rash or tenderness on the left labia. Cervix exhibits discharge. Cervix exhibits no motion tenderness and no friability. No tenderness in the vagina. Vaginal discharge (white, clumpy) found.  Musculoskeletal: Normal range of motion. She exhibits no edema or tenderness.  Lymphadenopathy:    She has no cervical adenopathy.  Neurological: She is alert and oriented to person, place, and time. She has normal reflexes.  Skin: Skin is warm and dry. No rash noted.  Psychiatric: She has a normal mood and affect. Her behavior is normal. Judgment and thought content normal.     Lab Results  Component Value Date   WBC 9.6 02/28/2016   HGB 14.5 12/13/2014   HCT 42.7 02/28/2016   PLT 220 02/28/2016   GLUCOSE 114 (H) 02/28/2016   CHOL 122 02/28/2016   TRIG 94 02/28/2016   HDL 33 (L) 02/28/2016   LDLCALC 70 02/28/2016   ALT 22 02/28/2016   AST 17 02/28/2016   NA 143 02/28/2016   K 3.9 02/28/2016   CL 101 02/28/2016   CREATININE 1.00 02/28/2016   BUN 11 02/28/2016   CO2 25 02/28/2016   TSH 1.500 12/13/2014    Patient was never admitted.  Assessment & Plan:   Gina Alexander was seen today for gynecologic exam.  Diagnoses and all orders for this visit:  Encounter for gynecological examination with abnormal finding -     CBC with Differential/Platelet -     CMP14+EGFR -     TSH  Cystitis -     CBC with Differential/Platelet -     CMP14+EGFR -     TSH  Yeast infection of the vagina -     CMP14+EGFR -     TSH  Mixed hyperlipidemia -     CMP14+EGFR -     Lipid panel -     TSH -     VITAMIN D 25 Hydroxy (Vit-D Deficiency, Fractures)  Essential hypertension -     CBC with Differential/Platelet -     CMP14+EGFR -     TSH  Asymptomatic HIV infection (HCC) -     CBC with Differential/Platelet  Other orders -     fluconazole (DIFLUCAN) 100 MG tablet; Take 1 tablet (100 mg total) by mouth daily. -      ciprofloxacin (CIPRO) 500 MG tablet; Take 1 tablet (500 mg total) by mouth 2 (two) times daily.     I am having Gina Alexander start on fluconazole and ciprofloxacin. I am also having her maintain her aspirin, ferrous sulfate, abacavir-dolutegravir-lamiVUDine, valACYclovir, docusate sodium, fluocinonide-emollient, atorvastatin, traMADol, sertraline, and amLODipine.  Meds ordered this encounter  Medications  . fluconazole (DIFLUCAN) 100 MG tablet    Sig: Take 1 tablet (100 mg total) by mouth daily.    Dispense:  15 tablet    Refill:  0  . ciprofloxacin (CIPRO) 500 MG tablet    Sig: Take 1 tablet (500 mg total) by mouth 2 (two) times daily.    Dispense:  14 tablet    Refill:  0     Follow-up: Return in about 6 weeks (around 04/23/2016) for hypertension.  Gina Alexander, M.D.

## 2016-03-12 NOTE — Addendum Note (Signed)
Addended by: Margurite AuerbachOMPTON, KARLA G on: 03/12/2016 03:11 PM   Modules accepted: Orders

## 2016-03-13 LAB — CMP14+EGFR
A/G RATIO: 1.7 (ref 1.2–2.2)
ALT: 20 IU/L (ref 0–32)
AST: 17 IU/L (ref 0–40)
Albumin: 4.3 g/dL (ref 3.5–5.5)
Alkaline Phosphatase: 87 IU/L (ref 39–117)
BUN / CREAT RATIO: 10 (ref 9–23)
BUN: 9 mg/dL (ref 6–24)
Bilirubin Total: 0.5 mg/dL (ref 0.0–1.2)
CALCIUM: 9.4 mg/dL (ref 8.7–10.2)
CHLORIDE: 101 mmol/L (ref 96–106)
CO2: 25 mmol/L (ref 18–29)
CREATININE: 0.87 mg/dL (ref 0.57–1.00)
GFR calc non Af Amer: 82 mL/min/{1.73_m2} (ref >59)
GFR, EST AFRICAN AMERICAN: 94 mL/min/{1.73_m2} (ref >59)
GLOBULIN, TOTAL: 2.5 g/dL (ref 1.5–4.5)
Glucose: 102 mg/dL — ABNORMAL HIGH (ref 65–99)
POTASSIUM: 3.3 mmol/L — AB (ref 3.5–5.2)
SODIUM: 142 mmol/L (ref 134–144)
TOTAL PROTEIN: 6.8 g/dL (ref 6.0–8.5)

## 2016-03-13 LAB — CBC WITH DIFFERENTIAL/PLATELET
BASOS: 1 %
Basophils Absolute: 0 10*3/uL (ref 0.0–0.2)
EOS (ABSOLUTE): 0.1 10*3/uL (ref 0.0–0.4)
EOS: 2 %
HEMATOCRIT: 40.6 % (ref 34.0–46.6)
Hemoglobin: 14.3 g/dL (ref 11.1–15.9)
IMMATURE GRANS (ABS): 0 10*3/uL (ref 0.0–0.1)
IMMATURE GRANULOCYTES: 0 %
LYMPHS: 30 %
Lymphocytes Absolute: 2.6 10*3/uL (ref 0.7–3.1)
MCH: 32.1 pg (ref 26.6–33.0)
MCHC: 35.2 g/dL (ref 31.5–35.7)
MCV: 91 fL (ref 79–97)
MONOCYTES: 8 %
Monocytes Absolute: 0.7 10*3/uL (ref 0.1–0.9)
NEUTROS PCT: 59 %
Neutrophils Absolute: 5.2 10*3/uL (ref 1.4–7.0)
Platelets: 217 10*3/uL (ref 150–379)
RBC: 4.45 x10E6/uL (ref 3.77–5.28)
RDW: 12.6 % (ref 12.3–15.4)
WBC: 8.6 10*3/uL (ref 3.4–10.8)

## 2016-03-13 LAB — LIPID PANEL
CHOL/HDL RATIO: 3.1 ratio (ref 0.0–4.4)
Cholesterol, Total: 98 mg/dL — ABNORMAL LOW (ref 100–199)
HDL: 32 mg/dL — AB (ref >39)
LDL CALC: 29 mg/dL (ref 0–99)
Triglycerides: 187 mg/dL — ABNORMAL HIGH (ref 0–149)
VLDL CHOLESTEROL CAL: 37 mg/dL (ref 5–40)

## 2016-03-13 LAB — VITAMIN D 25 HYDROXY (VIT D DEFICIENCY, FRACTURES): Vit D, 25-Hydroxy: 29.1 ng/mL — ABNORMAL LOW (ref 30.0–100.0)

## 2016-03-13 LAB — TSH: TSH: 1.09 u[IU]/mL (ref 0.450–4.500)

## 2016-03-14 LAB — URINE CULTURE

## 2016-03-14 MED ORDER — VITAMIN D (ERGOCALCIFEROL) 1.25 MG (50000 UNIT) PO CAPS: ORAL_CAPSULE | ORAL | 0 refills | Status: DC

## 2016-03-14 NOTE — Addendum Note (Signed)
Addended by: Tamera PuntWRAY, Kaileia Flow S on: 03/14/2016 06:22 PM   Modules accepted: Orders

## 2016-03-15 ENCOUNTER — Encounter: Payer: Self-pay | Admitting: Family Medicine

## 2016-03-15 LAB — PAP IG (IMAGE GUIDED): PAP Smear Comment: 0

## 2016-03-15 NOTE — Telephone Encounter (Signed)
Talked with pt on phone. Hurts on L side of throat in the morning when she swallows. No other URI symptoms. HA she says got down to a 2/10, was having them before elevated BP treated and headaches were much worse. Now since starting cipro HA up to a 4. Still with small amount of low back pain. No dysuria. No fevers at home with this infection. Having loose stools, no abd pain. Pt to take cipro through 5 days of treatment for cystitis, if intolerable side effects ok to stop. If symptoms return, come back to be seen. Unlikely sore throat related to cipro. If not improving needs to be seen. Pt in agreement with plan.

## 2016-03-18 DIAGNOSIS — B20 Human immunodeficiency virus [HIV] disease: Secondary | ICD-10-CM | POA: Diagnosis not present

## 2016-03-18 DIAGNOSIS — D013 Carcinoma in situ of anus and anal canal: Secondary | ICD-10-CM | POA: Diagnosis not present

## 2016-03-18 DIAGNOSIS — Z006 Encounter for examination for normal comparison and control in clinical research program: Secondary | ICD-10-CM | POA: Diagnosis not present

## 2016-03-18 DIAGNOSIS — K6282 Dysplasia of anus: Secondary | ICD-10-CM | POA: Diagnosis not present

## 2016-03-18 DIAGNOSIS — R85612 Low grade squamous intraepithelial lesion on cytologic smear of anus (LGSIL): Secondary | ICD-10-CM | POA: Diagnosis not present

## 2016-03-18 DIAGNOSIS — R85610 Atypical squamous cells of undetermined significance on cytologic smear of anus (ASC-US): Secondary | ICD-10-CM | POA: Diagnosis not present

## 2016-03-18 DIAGNOSIS — B009 Herpesviral infection, unspecified: Secondary | ICD-10-CM | POA: Diagnosis not present

## 2016-03-18 NOTE — Progress Notes (Signed)
Your recent Pap smear performed in the office came back normal.

## 2016-04-20 ENCOUNTER — Ambulatory Visit: Payer: Medicare Other

## 2016-05-24 ENCOUNTER — Other Ambulatory Visit: Payer: Self-pay | Admitting: Family Medicine

## 2016-06-05 ENCOUNTER — Encounter: Payer: Self-pay | Admitting: Family Medicine

## 2016-06-11 NOTE — Telephone Encounter (Signed)
Please schedule pt. For my next Sat. AM. Thanks, WS

## 2016-06-24 ENCOUNTER — Other Ambulatory Visit: Payer: Self-pay | Admitting: Family Medicine

## 2016-07-20 ENCOUNTER — Encounter: Payer: Self-pay | Admitting: Family Medicine

## 2016-07-20 ENCOUNTER — Ambulatory Visit (INDEPENDENT_AMBULATORY_CARE_PROVIDER_SITE_OTHER): Payer: Managed Care, Other (non HMO) | Admitting: Family Medicine

## 2016-07-20 VITALS — BP 118/84 | HR 58 | Temp 97.0°F | Ht 59.0 in | Wt 142.2 lb

## 2016-07-20 DIAGNOSIS — I10 Essential (primary) hypertension: Secondary | ICD-10-CM | POA: Diagnosis not present

## 2016-07-20 DIAGNOSIS — E782 Mixed hyperlipidemia: Secondary | ICD-10-CM | POA: Diagnosis not present

## 2016-07-20 DIAGNOSIS — B009 Herpesviral infection, unspecified: Secondary | ICD-10-CM

## 2016-07-20 DIAGNOSIS — F3341 Major depressive disorder, recurrent, in partial remission: Secondary | ICD-10-CM | POA: Diagnosis not present

## 2016-07-20 DIAGNOSIS — E559 Vitamin D deficiency, unspecified: Secondary | ICD-10-CM | POA: Diagnosis not present

## 2016-07-20 DIAGNOSIS — D509 Iron deficiency anemia, unspecified: Secondary | ICD-10-CM

## 2016-07-20 MED ORDER — TRAMADOL HCL 50 MG PO TABS
50.0000 mg | ORAL_TABLET | Freq: Four times a day (QID) | ORAL | 5 refills | Status: DC | PRN
Start: 2016-07-20 — End: 2018-02-18

## 2016-07-20 MED ORDER — FLUOCINONIDE-E 0.05 % EX CREA: TOPICAL_CREAM | CUTANEOUS | 5 refills | Status: DC

## 2016-07-20 MED ORDER — AMLODIPINE BESYLATE 5 MG PO TABS: 5.0000 mg | ORAL_TABLET | Freq: Every day | ORAL | 5 refills | Status: DC

## 2016-07-20 MED ORDER — SERTRALINE HCL 100 MG PO TABS: 150.0000 mg | ORAL_TABLET | Freq: Every day | ORAL | 5 refills | Status: DC

## 2016-07-20 MED ORDER — ATORVASTATIN CALCIUM 80 MG PO TABS: 40.0000 mg | ORAL_TABLET | Freq: Every day | ORAL | 5 refills | Status: DC

## 2016-07-20 MED ORDER — VALACYCLOVIR HCL 1 G PO TABS: ORAL_TABLET | ORAL | 0 refills | Status: DC
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
## 2016-07-22 LAB — CBC WITH DIFFERENTIAL/PLATELET
BASOS ABS: 0 10*3/uL (ref 0.0–0.2)
BASOS: 0 %
EOS (ABSOLUTE): 0.1 10*3/uL (ref 0.0–0.4)
Eos: 1 %
Hematocrit: 43.4 % (ref 34.0–46.6)
Hemoglobin: 14.7 g/dL (ref 11.1–15.9)
IMMATURE GRANULOCYTES: 0 %
Immature Grans (Abs): 0 10*3/uL (ref 0.0–0.1)
Lymphocytes Absolute: 3.3 10*3/uL — ABNORMAL HIGH (ref 0.7–3.1)
Lymphs: 34 %
MCH: 31.5 pg (ref 26.6–33.0)
MCHC: 33.9 g/dL (ref 31.5–35.7)
MCV: 93 fL (ref 79–97)
MONOS ABS: 0.7 10*3/uL (ref 0.1–0.9)
Monocytes: 7 %
NEUTROS PCT: 58 %
Neutrophils Absolute: 5.6 10*3/uL (ref 1.4–7.0)
PLATELETS: 242 10*3/uL (ref 150–379)
RBC: 4.67 x10E6/uL (ref 3.77–5.28)
RDW: 13.9 % (ref 12.3–15.4)
WBC: 9.8 10*3/uL (ref 3.4–10.8)

## 2016-07-22 LAB — CMP14+EGFR
A/G RATIO: 1.4 (ref 1.2–2.2)
ALT: 28 IU/L (ref 0–32)
AST: 21 IU/L (ref 0–40)
Albumin: 4.4 g/dL (ref 3.5–5.5)
Alkaline Phosphatase: 86 IU/L (ref 39–117)
BILIRUBIN TOTAL: 0.4 mg/dL (ref 0.0–1.2)
BUN/Creatinine Ratio: 12 (ref 9–23)
BUN: 11 mg/dL (ref 6–24)
CALCIUM: 9.4 mg/dL (ref 8.7–10.2)
CHLORIDE: 103 mmol/L (ref 96–106)
CO2: 22 mmol/L (ref 18–29)
Creatinine, Ser: 0.94 mg/dL (ref 0.57–1.00)
GFR calc Af Amer: 85 mL/min/{1.73_m2} (ref >59)
GFR, EST NON AFRICAN AMERICAN: 74 mL/min/{1.73_m2} (ref >59)
Globulin, Total: 3.1 g/dL (ref 1.5–4.5)
Glucose: 108 mg/dL — ABNORMAL HIGH (ref 65–99)
POTASSIUM: 4.1 mmol/L (ref 3.5–5.2)
Sodium: 142 mmol/L (ref 134–144)
Total Protein: 7.5 g/dL (ref 6.0–8.5)

## 2016-07-22 LAB — LIPID PANEL
Chol/HDL Ratio: 3.4 ratio units (ref 0.0–4.4)
Cholesterol, Total: 150 mg/dL (ref 100–199)
HDL: 44 mg/dL (ref >39)
LDL Calculated: 75 mg/dL (ref 0–99)
TRIGLYCERIDES: 154 mg/dL — AB (ref 0–149)
VLDL Cholesterol Cal: 31 mg/dL (ref 5–40)

## 2016-07-22 LAB — VITAMIN D 25 HYDROXY (VIT D DEFICIENCY, FRACTURES): Vit D, 25-Hydroxy: 33.9 ng/mL (ref 30.0–100.0)

## 2016-07-25 ENCOUNTER — Other Ambulatory Visit: Payer: Self-pay | Admitting: Family Medicine

## 2016-07-26 ENCOUNTER — Encounter: Payer: Self-pay | Admitting: Family Medicine

## 2016-08-23 ENCOUNTER — Other Ambulatory Visit: Payer: Self-pay | Admitting: Family Medicine

## 2016-10-19 ENCOUNTER — Ambulatory Visit: Payer: Medicare Other

## 2016-11-01 ENCOUNTER — Encounter: Payer: Self-pay | Admitting: Family Medicine

## 2016-11-01 ENCOUNTER — Ambulatory Visit (INDEPENDENT_AMBULATORY_CARE_PROVIDER_SITE_OTHER): Payer: Managed Care, Other (non HMO) | Admitting: Family Medicine

## 2016-11-01 VITALS — BP 121/76 | HR 60 | Temp 98.1°F | Ht 59.0 in | Wt 149.0 lb

## 2016-11-01 DIAGNOSIS — E782 Mixed hyperlipidemia: Secondary | ICD-10-CM | POA: Diagnosis not present

## 2016-11-01 DIAGNOSIS — I10 Essential (primary) hypertension: Secondary | ICD-10-CM

## 2016-11-01 DIAGNOSIS — Z21 Asymptomatic human immunodeficiency virus [HIV] infection status: Secondary | ICD-10-CM

## 2016-11-01 DIAGNOSIS — F3342 Major depressive disorder, recurrent, in full remission: Secondary | ICD-10-CM | POA: Diagnosis not present

## 2016-11-01 MED ORDER — ESCITALOPRAM OXALATE 10 MG PO TABS: 10.0000 mg | ORAL_TABLET | Freq: Every day | ORAL | 1 refills | Status: DC
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
## 2016-11-04 DIAGNOSIS — R21 Rash and other nonspecific skin eruption: Secondary | ICD-10-CM | POA: Diagnosis not present

## 2016-11-05 ENCOUNTER — Other Ambulatory Visit: Payer: Self-pay | Admitting: Family Medicine

## 2016-11-05 ENCOUNTER — Encounter: Payer: Self-pay | Admitting: Family Medicine

## 2016-11-06 DIAGNOSIS — B029 Zoster without complications: Secondary | ICD-10-CM | POA: Diagnosis not present

## 2016-11-06 DIAGNOSIS — Z87891 Personal history of nicotine dependence: Secondary | ICD-10-CM | POA: Diagnosis not present

## 2016-11-06 DIAGNOSIS — I10 Essential (primary) hypertension: Secondary | ICD-10-CM | POA: Diagnosis not present

## 2016-11-06 DIAGNOSIS — G2581 Restless legs syndrome: Secondary | ICD-10-CM | POA: Diagnosis not present

## 2016-11-06 DIAGNOSIS — Z955 Presence of coronary angioplasty implant and graft: Secondary | ICD-10-CM | POA: Diagnosis not present

## 2016-11-06 DIAGNOSIS — E559 Vitamin D deficiency, unspecified: Secondary | ICD-10-CM | POA: Diagnosis not present

## 2016-11-06 DIAGNOSIS — Z79899 Other long term (current) drug therapy: Secondary | ICD-10-CM | POA: Diagnosis not present

## 2016-11-06 DIAGNOSIS — Z7982 Long term (current) use of aspirin: Secondary | ICD-10-CM | POA: Diagnosis not present

## 2016-11-06 DIAGNOSIS — I2510 Atherosclerotic heart disease of native coronary artery without angina pectoris: Secondary | ICD-10-CM | POA: Diagnosis not present

## 2016-11-18 DIAGNOSIS — Z006 Encounter for examination for normal comparison and control in clinical research program: Secondary | ICD-10-CM | POA: Diagnosis not present

## 2016-11-18 DIAGNOSIS — B20 Human immunodeficiency virus [HIV] disease: Secondary | ICD-10-CM | POA: Diagnosis not present

## 2016-11-18 DIAGNOSIS — R85610 Atypical squamous cells of undetermined significance on cytologic smear of anus (ASC-US): Secondary | ICD-10-CM | POA: Diagnosis not present

## 2017-01-04 ENCOUNTER — Other Ambulatory Visit: Payer: Self-pay | Admitting: Family Medicine

## 2017-01-10 DIAGNOSIS — R0602 Shortness of breath: Secondary | ICD-10-CM | POA: Diagnosis not present

## 2017-01-10 DIAGNOSIS — Z888 Allergy status to other drugs, medicaments and biological substances status: Secondary | ICD-10-CM | POA: Diagnosis not present

## 2017-01-10 DIAGNOSIS — Z609 Problem related to social environment, unspecified: Secondary | ICD-10-CM | POA: Diagnosis not present

## 2017-01-10 DIAGNOSIS — Z7982 Long term (current) use of aspirin: Secondary | ICD-10-CM | POA: Diagnosis not present

## 2017-01-10 DIAGNOSIS — I252 Old myocardial infarction: Secondary | ICD-10-CM | POA: Diagnosis not present

## 2017-01-10 DIAGNOSIS — Z79899 Other long term (current) drug therapy: Secondary | ICD-10-CM | POA: Diagnosis not present

## 2017-01-10 DIAGNOSIS — Z639 Problem related to primary support group, unspecified: Secondary | ICD-10-CM | POA: Diagnosis not present

## 2017-01-10 DIAGNOSIS — R001 Bradycardia, unspecified: Secondary | ICD-10-CM | POA: Diagnosis not present

## 2017-01-10 DIAGNOSIS — D649 Anemia, unspecified: Secondary | ICD-10-CM | POA: Diagnosis not present

## 2017-01-10 DIAGNOSIS — Z882 Allergy status to sulfonamides status: Secondary | ICD-10-CM | POA: Diagnosis not present

## 2017-01-10 DIAGNOSIS — E785 Hyperlipidemia, unspecified: Secondary | ICD-10-CM | POA: Diagnosis not present

## 2017-01-10 DIAGNOSIS — Z955 Presence of coronary angioplasty implant and graft: Secondary | ICD-10-CM | POA: Diagnosis not present

## 2017-01-10 DIAGNOSIS — Z653 Problems related to other legal circumstances: Secondary | ICD-10-CM | POA: Diagnosis not present

## 2017-01-17 ENCOUNTER — Telehealth: Payer: Self-pay | Admitting: Family Medicine

## 2017-01-17 NOTE — Telephone Encounter (Signed)
Please advise 

## 2017-01-20 ENCOUNTER — Ambulatory Visit: Payer: Managed Care, Other (non HMO) | Admitting: Family

## 2017-01-22 NOTE — Telephone Encounter (Signed)
Spoke with pt and appt given with Dr Darlyn ReadStacks 8/24 at 4:10. Pt aware can't do Saturday appts for anxiety/hospital follow ups.

## 2017-01-31 ENCOUNTER — Encounter: Payer: Self-pay | Admitting: Family Medicine

## 2017-01-31 ENCOUNTER — Ambulatory Visit (INDEPENDENT_AMBULATORY_CARE_PROVIDER_SITE_OTHER): Payer: Managed Care, Other (non HMO) | Admitting: Family Medicine

## 2017-01-31 VITALS — BP 130/83 | HR 64 | Temp 97.4°F | Ht 59.0 in | Wt 149.0 lb

## 2017-01-31 DIAGNOSIS — F3341 Major depressive disorder, recurrent, in partial remission: Secondary | ICD-10-CM | POA: Diagnosis not present

## 2017-01-31 DIAGNOSIS — Z79899 Other long term (current) drug therapy: Secondary | ICD-10-CM

## 2017-01-31 MED ORDER — DULOXETINE HCL 60 MG PO CPEP: 60.0000 mg | ORAL_CAPSULE | Freq: Every day | ORAL | 3 refills | Status: DC
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
## 2017-02-04 LAB — TOXASSURE SELECT 13 (MW), URINE

## 2017-02-08 ENCOUNTER — Encounter: Payer: Self-pay | Admitting: Family Medicine

## 2017-02-18 ENCOUNTER — Ambulatory Visit: Payer: Managed Care, Other (non HMO) | Admitting: Family Medicine

## 2017-02-21 ENCOUNTER — Encounter: Payer: Self-pay | Admitting: Family Medicine

## 2017-02-21 ENCOUNTER — Ambulatory Visit (INDEPENDENT_AMBULATORY_CARE_PROVIDER_SITE_OTHER): Payer: Managed Care, Other (non HMO) | Admitting: Family Medicine

## 2017-02-21 VITALS — BP 126/79 | HR 74 | Temp 97.0°F | Ht 59.0 in | Wt 150.0 lb

## 2017-02-21 DIAGNOSIS — F3341 Major depressive disorder, recurrent, in partial remission: Secondary | ICD-10-CM

## 2017-02-21 DIAGNOSIS — F1910 Other psychoactive substance abuse, uncomplicated: Secondary | ICD-10-CM

## 2017-02-21 DIAGNOSIS — Z79899 Other long term (current) drug therapy: Secondary | ICD-10-CM | POA: Diagnosis not present

## 2017-02-21 MED ORDER — MIRTAZAPINE 15 MG PO TABS: 15.0000 mg | ORAL_TABLET | Freq: Every day | ORAL | 0 refills | Status: DC

## 2017-02-21 MED ORDER — HYDROXYZINE PAMOATE 50 MG PO CAPS: 50.0000 mg | ORAL_CAPSULE | Freq: Three times a day (TID) | ORAL | 3 refills | Status: DC | PRN

## 2017-02-22 NOTE — Progress Notes (Signed)
Subjective:  Patient ID: Gina Alexander, female    DOB: 03-18-1971  Age: 46 y.o. MRN: [id number]  CC: Medication Management (pt here today for follow up)   HPI Gina Alexander presents for recheck of depression. Recently became depressed over feelings of hopelessness in her attempts to regain custody of her two young daughters. Started Cymbalta but DCed after two days due to side effects including sleep disturbance, sweating spells and nightmares.Based on advice she has set up counseling. Couldn't get an appointment until Oct. 5.   Depression screen [facility] 2/9 02/21/2017 01/31/2017 11/01/2016  Decreased Interest 0 2 0  Down, Depressed, Hopeless PHQ - 2 Score Altered sleeping 3 3 -  Tired, decreased energy 1 3 -  Change in appetite 3 3 -  Feeling bad or failure about yourself  1 3 -  Trouble concentrating 0 3 -  Moving slowly or fidgety/restless 0 0 -  Suicidal thoughts 0 1 -  PHQ-9 Score 9 19 -  Difficult doing work/chores - - -    History Gina Alexander has a past medical history of HIV infection (HCC); Hyperlipidemia; and Myocardial infarction (HCC).   She has a past surgical history that includes Cholecystectomy; Tubal ligation (2006); uterine ablation; and Breast enhancement surgery.   Her family history includes Arthritis in her mother; COPD in her mother.She reports that she has quit smoking. She quit smokeless tobacco use about 3 years ago. She reports that she drinks about 1.2 oz of alcohol per week . She reports that she does not use drugs.    ROS Review of Systems  Constitutional: Negative for activity change, appetite change and fever.  HENT: Negative for congestion, rhinorrhea and sore throat.   Eyes: Negative for visual disturbance.  Respiratory: Negative for cough and shortness of breath.   Cardiovascular: Negative for chest pain and palpitations.  Gastrointestinal: Negative for abdominal pain, diarrhea and nausea.  Genitourinary: Negative for dysuria.    Musculoskeletal: Negative for arthralgias and myalgias.  Psychiatric/Behavioral: Positive for dysphoric mood and sleep disturbance. The patient is nervous/anxious.     Objective:  BP 126/79   Pulse 74   Temp (!) 97 F (36.1 C) (Oral)   Ht  (1.499 m)   Wt 150 lb (68 kg)   BMI 30.30 kg/m   BP Readings from Last 3 Encounters:  02/21/17 126/79  01/31/17 130/83  11/01/16 121/76    Wt Readings from Last 3 Encounters:  02/21/17 150 lb (68 kg)  01/31/17 149 lb (67.6 kg)  11/01/16 149 lb (67.6 kg)     Physical Exam  Constitutional: She is oriented to person, place, and time. She appears well-developed and well-nourished. No distress.  HENT:  Head: Normocephalic and atraumatic.  Right Ear: External ear normal.  Left Ear: External ear normal.  Nose: Nose normal.  Mouth/Throat: Oropharynx is clear and moist.  Eyes: Pupils are equal, round, and reactive to light. Conjunctivae and EOM are normal.  Neck: Normal range of motion. Neck supple. No thyromegaly present.  Cardiovascular: Normal rate, regular rhythm and normal heart sounds.   No murmur heard. Pulmonary/Chest: Effort normal and breath sounds normal. No respiratory distress. She has no wheezes. She has no rales.  Abdominal: Soft. Bowel sounds are normal. She exhibits no distension. There is no tenderness.  Lymphadenopathy:    She has no cervical adenopathy.  Neurological: She is alert and oriented to person, place, and time. She has normal reflexes.  Skin: Skin is  warm and dry.  Psychiatric: She has a normal mood and affect. Her behavior is normal. Judgment and thought content normal.      Assessment & Plan:   Gina Alexander was seen today for medication management.  Diagnoses and all orders for this visit:  Recurrent major depressive disorder, in partial remission (HCC) -     mirtazapine (REMERON) 15 MG tablet; Take 1 tablet (15 mg total) by mouth at bedtime.  Substance abuse -     ToxASSURE Select 13 (MW),  Urine  Other orders -     hydrOXYzine (VISTARIL) 50 MG capsule; Take 1 capsule (50 mg total) by mouth 3 (three) times daily as needed.       I have discontinued Ms. Brouse DULoxetine. I have also changed her hydrOXYzine. Additionally, I am having her start on mirtazapine. Lastly, I am having her maintain her aspirin, abacavir-dolutegravir-lamiVUDine, fluocinonide-emollient, amLODipine, traMADol, and atorvastatin.  Allergies as of 02/21/2017      Reactions   Efavirenz Dermatitis, Shortness Of Breath   Sulfa Antibiotics Rash   Cymbalta [duloxetine Hcl] Nausea Only   Causes insomnia   Nelfinavir    Other reaction(s): Other (See Comments) Mouth ulcers      Medication List       Accurate as of 02/21/17 11:59 PM. Always use your most recent med list.          amLODipine 5 MG tablet Commonly known as:  NORVASC Take 1 tablet (5 mg total) by mouth daily. For blood pressure   aspirin 81 MG chewable tablet Chew 81 mg by mouth.   atorvastatin 40 MG tablet Commonly known as:  LIPITOR TAKE 1 TABLETS (40 MG TOTAL) BY MOUTH DAILY.   fluocinonide-emollient 0.05 % cream Commonly known as:  LIDEX-E APPLY SPARINGLY TO AFFECTED AREAS TWICE DAILY   hydrOXYzine 50 MG capsule Commonly known as:  VISTARIL Take 1 capsule (50 mg total) by mouth 3 (three) times daily as needed.   mirtazapine 15 MG tablet Commonly known as:  REMERON Take 1 tablet (15 mg total) by mouth at bedtime.   traMADol 50 MG tablet Commonly known as:  ULTRAM Take 1 tablet (50 mg total) by mouth 4 (four) times daily as needed for moderate pain.   TRIUMEQ 600-50-300 MG tablet Generic drug:  abacavir-dolutegravir-lamiVUDine Take 1 tablet by mouth daily.            Discharge Care Instructions        Start     Ordered   02/21/17 0000  hydrOXYzine (VISTARIL) 50 MG capsule  3 times daily PRN     02/21/17 1641   02/21/17 0000  mirtazapine (REMERON) 15 MG tablet  Daily at bedtime     02/21/17 1641    02/21/17 0000  ToxASSURE Select 13 (MW), Urine     02/21/17 1643       Follow-up: Return in about 2 weeks (around 03/07/2017).  Mechele Claude, M.D.

## 2017-02-26 ENCOUNTER — Other Ambulatory Visit: Payer: Self-pay | Admitting: Family Medicine

## 2017-02-27 LAB — TOXASSURE SELECT 13 (MW), URINE

## 2017-03-07 ENCOUNTER — Ambulatory Visit (INDEPENDENT_AMBULATORY_CARE_PROVIDER_SITE_OTHER): Payer: Managed Care, Other (non HMO) | Admitting: Family Medicine

## 2017-03-07 ENCOUNTER — Encounter: Payer: Self-pay | Admitting: Family Medicine

## 2017-03-07 VITALS — BP 139/81 | HR 70 | Temp 97.4°F | Ht 59.0 in | Wt 150.0 lb

## 2017-03-07 DIAGNOSIS — F5102 Adjustment insomnia: Secondary | ICD-10-CM | POA: Diagnosis not present

## 2017-03-07 DIAGNOSIS — Z789 Other specified health status: Secondary | ICD-10-CM

## 2017-03-07 DIAGNOSIS — F3341 Major depressive disorder, recurrent, in partial remission: Secondary | ICD-10-CM

## 2017-03-07 NOTE — Progress Notes (Signed)
Subjective:  Patient ID: Gina Alexander, female    DOB: 04/24/1971  Age: 46 y.o. MRN: [id number]  CC: Depression   HPI Sebrena Engh presents for depressionn related to her desire to have custod of her daughters. She was in court four days ago and was told that she wasn't reliable due to recent suicide attempt. (See previous note) ex-mother-in -law will have custody for 6 mos. Ten she can try again. Also was told she needed to be more affirming of the girls.   Could not tolerate mirtazipine - slept constantly - wiping her out. Feels better off meds. Wants to have a washout period to see if she can cope off meds.   Depression screen [facility] 2/9 02/21/2017 01/31/2017 11/01/2016  Decreased Interest 0 2 0  Down, Depressed, Hopeless PHQ - 2 Score Altered sleeping 3 3 -  Tired, decreased energy 1 3 -  Change in appetite 3 3 -  Feeling bad or failure about yourself  1 3 -  Trouble concentrating 0 3 -  Moving slowly or fidgety/restless 0 0 -  Suicidal thoughts 0 1 -  PHQ-9 Score 9 19 -  Difficult doing work/chores - - -    History Carlene has a past medical history of HIV infection (HCC); Hyperlipidemia; and Myocardial infarction (HCC).   She has a past surgical history that includes Cholecystectomy; Tubal ligation (2006); uterine ablation; and Breast enhancement surgery.   Her family history includes Arthritis in her mother; COPD in her mother.She reports that she has quit smoking. She quit smokeless tobacco use about 3 years ago. She reports that she drinks about 1.2 oz of alcohol per week . She reports that she does not use drugs.    ROS Review of Systems  Constitutional: Negative for activity change, appetite change and fever.  HENT: Negative for congestion, rhinorrhea and sore throat.   Eyes: Negative for visual disturbance.  Respiratory: Negative for cough and shortness of breath.   Cardiovascular: Negative for chest pain and palpitations.  Gastrointestinal:  Negative for abdominal pain, diarrhea and nausea.  Genitourinary: Negative for dysuria.  Musculoskeletal: Negative for arthralgias and myalgias.  Psychiatric/Behavioral: Positive for dysphoric mood and sleep disturbance.    Objective:  BP 139/81   Pulse 70   Temp (!) 97.4 F (36.3 C) (Oral)   Ht  (1.499 m)   Wt 150 lb (68 kg)   BMI 30.30 kg/m   BP Readings from Last 3 Encounters:  03/07/17 139/81  02/21/17 126/79  01/31/17 130/83    Wt Readings from Last 3 Encounters:  03/07/17 150 lb (68 kg)  02/21/17 150 lb (68 kg)  01/31/17 149 lb (67.6 kg)     Physical Exam  Constitutional: She is oriented to person, place, and time. She appears well-developed and well-nourished. No distress.  HENT:  Head: Normocephalic and atraumatic.  Eyes: Pupils are equal, round, and reactive to light. Conjunctivae are normal.  Neck: Normal range of motion. Neck supple. No thyromegaly present.  Cardiovascular: Normal rate, regular rhythm and normal heart sounds.   No murmur heard. Pulmonary/Chest: Effort normal and breath sounds normal. No respiratory distress. She has no wheezes. She has no rales.  Abdominal: Soft.  Musculoskeletal: Normal range of motion.  Lymphadenopathy:    She has no cervical adenopathy.  Neurological: She is alert and oriented to person, place, and time.  Skin: Skin is warm and dry.  Psychiatric: She has a normal mood and affect.  Her behavior is normal. Judgment and thought content normal.      Assessment & Plan:   Ilisa was seen today for depression.  Diagnoses and all orders for this visit:  Recurrent major depressive disorder, in partial remission (HCC)  Medication intolerance  Adjustment insomnia       I am having Ms. Gombos maintain her aspirin, abacavir-dolutegravir-lamiVUDine, fluocinonide-emollient, amLODipine, traMADol, atorvastatin, hydrOXYzine, mirtazapine, and sertraline.  Allergies as of 03/07/2017      Reactions   Efavirenz  Dermatitis, Shortness Of Breath   Sulfa Antibiotics Rash   Cymbalta [duloxetine Hcl] Nausea Only   Causes insomnia   Nelfinavir    Other reaction(s): Other (See Comments) Mouth ulcers      Medication List       Accurate as of 03/07/17 11:59 PM. Always use your most recent med list.          amLODipine 5 MG tablet Commonly known as:  NORVASC Take 1 tablet (5 mg total) by mouth daily. For blood pressure   aspirin 81 MG chewable tablet Chew 81 mg by mouth.   atorvastatin 40 MG tablet Commonly known as:  LIPITOR TAKE 1 TABLETS (40 MG TOTAL) BY MOUTH DAILY.   fluocinonide-emollient 0.05 % cream Commonly known as:  LIDEX-E APPLY SPARINGLY TO AFFECTED AREAS TWICE DAILY   hydrOXYzine 50 MG capsule Commonly known as:  VISTARIL Take 1 capsule (50 mg total) by mouth 3 (three) times daily as needed.   mirtazapine 15 MG tablet Commonly known as:  REMERON Take 1 tablet (15 mg total) by mouth at bedtime.   sertraline 100 MG tablet Commonly known as:  ZOLOFT TAKE 1 TABLET BY MOUTH EVERY DAY   traMADol 50 MG tablet Commonly known as:  ULTRAM Take 1 tablet (50 mg total) by mouth 4 (four) times daily as needed for moderate pain.   TRIUMEQ 600-50-300 MG tablet Generic drug:  abacavir-dolutegravir-lamiVUDine Take 1 tablet by mouth daily.        Follow-up: Return in about 2 weeks (around 03/21/2017).  Mechele Claude, M.D.

## 2017-03-08 ENCOUNTER — Encounter: Payer: Self-pay | Admitting: Family Medicine

## 2017-03-10 ENCOUNTER — Other Ambulatory Visit: Payer: Self-pay | Admitting: Family Medicine

## 2017-03-20 ENCOUNTER — Other Ambulatory Visit: Payer: Self-pay | Admitting: Family Medicine

## 2017-03-20 DIAGNOSIS — F3341 Major depressive disorder, recurrent, in partial remission: Secondary | ICD-10-CM

## 2017-03-21 ENCOUNTER — Ambulatory Visit: Payer: Managed Care, Other (non HMO) | Admitting: Family Medicine

## 2017-03-25 ENCOUNTER — Telehealth: Payer: Self-pay | Admitting: Family Medicine

## 2017-03-25 NOTE — Telephone Encounter (Signed)
Pt states its concerning medication only wants to speak with stacks nurse. Would not give any information.

## 2017-03-26 ENCOUNTER — Encounter: Payer: Self-pay | Admitting: Family Medicine

## 2017-03-26 NOTE — Telephone Encounter (Signed)
Pt is requesting a letter on letterhead stating the Lexapro can cause suicidal ideation and the patient is no longer taking this medication.

## 2017-03-26 NOTE — Telephone Encounter (Signed)
Letter wrote and placed up front. Attempted to contact patient and NA

## 2017-03-26 NOTE — Telephone Encounter (Signed)
Please  write and I will sign. Thanks, WS 

## 2017-03-27 ENCOUNTER — Other Ambulatory Visit: Payer: Self-pay

## 2017-03-27 NOTE — Patient Outreach (Signed)
[facility] Care Management  03/27/2017  Gina RenderHeather Lawson 29-May-1971 [id number]   Medication Adherence call to Mrs. Gina RenderHeather Alexander the reason for this call is because [name] is showing past due under [facility] [employer]on Atorvastatin 40 mg spoke to patient she said she has been taking this medication on a regular basis but the pharmacy keeps filling her prescription early she still has medication and will not fill this medication at this time until she is finished with what she have.   Lillia AbedAna Ollison-Moran CPhT Pharmacy Technician [facility] Care Management Direct Dial [phone]  Fax [phone] [email or web address]

## 2017-03-30 ENCOUNTER — Encounter: Payer: Self-pay | Admitting: Family Medicine

## 2017-03-31 ENCOUNTER — Other Ambulatory Visit: Payer: Self-pay | Admitting: Family Medicine

## 2017-03-31 MED ORDER — ATORVASTATIN CALCIUM 40 MG PO TABS: ORAL_TABLET | ORAL | 5 refills | Status: DC

## 2017-04-15 DIAGNOSIS — Z1211 Encounter for screening for malignant neoplasm of colon: Secondary | ICD-10-CM | POA: Diagnosis not present

## 2017-04-15 DIAGNOSIS — R85613 High grade squamous intraepithelial lesion on cytologic smear of anus (HGSIL): Secondary | ICD-10-CM | POA: Diagnosis not present

## 2017-04-15 DIAGNOSIS — B20 Human immunodeficiency virus [HIV] disease: Secondary | ICD-10-CM | POA: Diagnosis not present

## 2017-04-15 DIAGNOSIS — Z006 Encounter for examination for normal comparison and control in clinical research program: Secondary | ICD-10-CM | POA: Diagnosis not present

## 2017-04-17 NOTE — Telephone Encounter (Signed)
Taken care of in different encounter.  This encounter will now be closed  

## 2017-04-18 ENCOUNTER — Encounter: Payer: Self-pay | Admitting: Family Medicine

## 2017-04-18 NOTE — Telephone Encounter (Signed)
Please reprint and send. Thanks, WS

## 2017-05-05 ENCOUNTER — Encounter: Payer: Self-pay | Admitting: Family Medicine

## 2017-05-05 DIAGNOSIS — J0100 Acute maxillary sinusitis, unspecified: Secondary | ICD-10-CM | POA: Diagnosis not present

## 2017-05-05 DIAGNOSIS — J40 Bronchitis, not specified as acute or chronic: Secondary | ICD-10-CM | POA: Diagnosis not present

## 2017-05-06 ENCOUNTER — Encounter: Payer: Self-pay | Admitting: Family Medicine

## 2017-05-06 ENCOUNTER — Telehealth: Payer: Self-pay | Admitting: Family Medicine

## 2017-05-07 ENCOUNTER — Ambulatory Visit: Payer: Managed Care, Other (non HMO) | Admitting: Family Medicine

## 2017-05-12 ENCOUNTER — Other Ambulatory Visit: Payer: Self-pay | Admitting: *Deleted

## 2017-05-12 DIAGNOSIS — I10 Essential (primary) hypertension: Secondary | ICD-10-CM

## 2017-05-12 DIAGNOSIS — E782 Mixed hyperlipidemia: Secondary | ICD-10-CM

## 2017-05-12 DIAGNOSIS — Z21 Asymptomatic human immunodeficiency virus [HIV] infection status: Secondary | ICD-10-CM

## 2017-05-12 NOTE — Telephone Encounter (Signed)
Please order CBC, CMP, TSH, lipid profile

## 2017-05-12 NOTE — Progress Notes (Signed)
Labs placed.

## 2017-05-14 ENCOUNTER — Telehealth: Payer: Self-pay | Admitting: Family Medicine

## 2017-05-14 DIAGNOSIS — I10 Essential (primary) hypertension: Secondary | ICD-10-CM

## 2017-05-14 DIAGNOSIS — F3342 Major depressive disorder, recurrent, in full remission: Secondary | ICD-10-CM

## 2017-05-14 DIAGNOSIS — E782 Mixed hyperlipidemia: Secondary | ICD-10-CM

## 2017-05-15 NOTE — Telephone Encounter (Signed)
Lab order placed to (lab collect) so any [facility] can see the orders. Left detailed message on pt voicemail stating orders are in and to call back with any further questions or concerns.

## 2017-05-20 ENCOUNTER — Other Ambulatory Visit: Payer: Self-pay | Admitting: Family

## 2017-05-20 DIAGNOSIS — F3342 Major depressive disorder, recurrent, in full remission: Secondary | ICD-10-CM

## 2017-05-20 DIAGNOSIS — E782 Mixed hyperlipidemia: Secondary | ICD-10-CM

## 2017-05-20 DIAGNOSIS — I10 Essential (primary) hypertension: Secondary | ICD-10-CM

## 2017-05-21 ENCOUNTER — Encounter: Payer: Self-pay | Admitting: Family Medicine

## 2017-05-21 ENCOUNTER — Ambulatory Visit (INDEPENDENT_AMBULATORY_CARE_PROVIDER_SITE_OTHER): Payer: Managed Care, Other (non HMO) | Admitting: Family Medicine

## 2017-05-21 VITALS — BP 123/82 | HR 72 | Temp 96.9°F | Ht 59.0 in | Wt 147.0 lb

## 2017-05-21 DIAGNOSIS — N921 Excessive and frequent menstruation with irregular cycle: Secondary | ICD-10-CM | POA: Diagnosis not present

## 2017-05-21 DIAGNOSIS — F3342 Major depressive disorder, recurrent, in full remission: Secondary | ICD-10-CM

## 2017-05-21 DIAGNOSIS — I2510 Atherosclerotic heart disease of native coronary artery without angina pectoris: Secondary | ICD-10-CM | POA: Diagnosis not present

## 2017-05-21 DIAGNOSIS — R5383 Other fatigue: Secondary | ICD-10-CM | POA: Diagnosis not present

## 2017-05-21 DIAGNOSIS — M79605 Pain in left leg: Secondary | ICD-10-CM | POA: Diagnosis not present

## 2017-05-21 DIAGNOSIS — G2581 Restless legs syndrome: Secondary | ICD-10-CM | POA: Diagnosis not present

## 2017-05-21 DIAGNOSIS — Z21 Asymptomatic human immunodeficiency virus [HIV] infection status: Secondary | ICD-10-CM | POA: Diagnosis not present

## 2017-05-21 DIAGNOSIS — M79604 Pain in right leg: Secondary | ICD-10-CM | POA: Diagnosis not present

## 2017-05-21 DIAGNOSIS — E782 Mixed hyperlipidemia: Secondary | ICD-10-CM | POA: Diagnosis not present

## 2017-05-21 DIAGNOSIS — I10 Essential (primary) hypertension: Secondary | ICD-10-CM | POA: Diagnosis not present

## 2017-05-21 DIAGNOSIS — R030 Elevated blood-pressure reading, without diagnosis of hypertension: Secondary | ICD-10-CM | POA: Diagnosis not present

## 2017-05-22 LAB — CMP14+EGFR
A/G RATIO: 1.4 (ref 1.2–2.2)
ALK PHOS: 109 IU/L (ref 39–117)
ALT: 16 IU/L (ref 0–32)
AST: 15 IU/L (ref 0–40)
Albumin: 4.4 g/dL (ref 3.5–5.5)
BILIRUBIN TOTAL: 0.3 mg/dL (ref 0.0–1.2)
BUN / CREAT RATIO: 8 — AB (ref 9–23)
BUN: 8 mg/dL (ref 6–24)
CO2: 25 mmol/L (ref 20–29)
Calcium: 9.2 mg/dL (ref 8.7–10.2)
Chloride: 103 mmol/L (ref 96–106)
Creatinine, Ser: 0.97 mg/dL (ref 0.57–1.00)
GFR calc Af Amer: 82 mL/min/{1.73_m2} (ref >59)
GFR calc non Af Amer: 71 mL/min/{1.73_m2} (ref >59)
GLOBULIN, TOTAL: 3.1 g/dL (ref 1.5–4.5)
Glucose: 96 mg/dL (ref 65–99)
POTASSIUM: 3.8 mmol/L (ref 3.5–5.2)
SODIUM: 142 mmol/L (ref 134–144)
TOTAL PROTEIN: 7.5 g/dL (ref 6.0–8.5)

## 2017-05-22 LAB — LIPID PANEL
CHOL/HDL RATIO: 3.8 ratio (ref 0.0–4.4)
Cholesterol, Total: 118 mg/dL (ref 100–199)
HDL: 31 mg/dL — ABNORMAL LOW (ref >39)
LDL CALC: 54 mg/dL (ref 0–99)
Triglycerides: 165 mg/dL — ABNORMAL HIGH (ref 0–149)
VLDL Cholesterol Cal: 33 mg/dL (ref 5–40)

## 2017-05-22 LAB — RNA, REAL TIME PCR (GRAPH)

## 2017-05-22 LAB — CBC WITH DIFFERENTIAL/PLATELET
BASOS: 0 %
Basophils Absolute: 0 10*3/uL (ref 0.0–0.2)
EOS (ABSOLUTE): 0.2 10*3/uL (ref 0.0–0.4)
EOS: 2 %
HEMATOCRIT: 39.8 % (ref 34.0–46.6)
Hemoglobin: 14 g/dL (ref 11.1–15.9)
IMMATURE GRANS (ABS): 0 10*3/uL (ref 0.0–0.1)
Immature Granulocytes: 0 %
LYMPHS: 37 %
Lymphocytes Absolute: 4.1 10*3/uL — ABNORMAL HIGH (ref 0.7–3.1)
MCH: 31.4 pg (ref 26.6–33.0)
MCHC: 35.2 g/dL (ref 31.5–35.7)
MCV: 89 fL (ref 79–97)
MONOS ABS: 0.8 10*3/uL (ref 0.1–0.9)
Monocytes: 7 %
NEUTROS ABS: 6.1 10*3/uL (ref 1.4–7.0)
Neutrophils: 54 %
PLATELETS: 252 10*3/uL (ref 150–379)
RBC: 4.46 x10E6/uL (ref 3.77–5.28)
RDW: 14.2 % (ref 12.3–15.4)
WBC: 11.2 10*3/uL — ABNORMAL HIGH (ref 3.4–10.8)

## 2017-05-22 LAB — TSH: TSH: 0.872 u[IU]/mL (ref 0.450–4.500)

## 2017-05-22 LAB — PHOSPHORUS: PHOSPHORUS: 3.5 mg/dL (ref 2.5–4.5)

## 2017-05-22 LAB — VITAMIN D 25 HYDROXY (VIT D DEFICIENCY, FRACTURES): VIT D 25 HYDROXY: 22.4 ng/mL — AB (ref 30.0–100.0)

## 2017-05-22 LAB — MAGNESIUM: Magnesium: 2.1 mg/dL (ref 1.6–2.3)

## 2017-05-23 ENCOUNTER — Other Ambulatory Visit: Payer: Self-pay | Admitting: *Deleted

## 2017-05-23 MED ORDER — VITAMIN D (ERGOCALCIFEROL) 1.25 MG (50000 UNIT) PO CAPS: 50000.0000 [IU] | ORAL_CAPSULE | ORAL | 0 refills | Status: DC

## 2017-05-25 ENCOUNTER — Encounter: Payer: Self-pay | Admitting: Family Medicine

## 2017-05-25 NOTE — Progress Notes (Signed)
Subjective:  Patient ID: Gina Alexander, female    DOB: July 12, 1970  Age: 46 y.o. MRN: [id number]  CC: Follow-up (pt here today for routine follow up of her chronic medical conditions)   HPI Fayth Trefry presents for follow-up of hypertension. Patient has no history of headache chest pain or shortness of breath or recent cough. Patient also denies symptoms of TIA such as numbness weakness lateralizing. Patient checks  blood pressure at home and has not had any elevated readings recently. Patient denies side effects from his medication. States taking it regularly. Patient in for follow-up of elevated cholesterol. Doing well without complaints on current medication. Denies side effects of statin including myalgia and arthralgia and nausea. Also in today for liver function testing. Currently no chest pain, shortness of breath or other cardiovascular related symptoms noted.  Patient has been doing quite well without signs of depression since discontinuing her mirtazapine and sertraline 3 months ago.  No signs of recurrent depression no suicidal ideation.  No thoughts of self-harm.  Her only concern is that her legs not up and her toes cramp at night periodically.  Patient is followed by infectious disease at [facility] for her HIV the disease.  She has been free of opportunistic infections and has been followed by CD4 counts and viral loads.  Those are to be drawn here with her routine blood work today and forwarded to her ID physician. Depression screen [facility] 2/9 05/21/2017 02/21/2017 01/31/2017  Decreased Interest 0 0 2  Down, Depressed, Hopeless 0 1 1  PHQ - 2 Score 0 1 3  Altered sleeping - 3 3  Tired, decreased energy - 1 3  Change in appetite - 3 3  Feeling bad or failure about yourself  - 1 3  Trouble concentrating - 0 3  Moving slowly or fidgety/restless - 0 0  Suicidal thoughts - 0 1  PHQ-9 Score - 9 19  Difficult doing work/chores - - -    History Ilana has a past medical  history of HIV infection ([place]), Hyperlipidemia, and Myocardial infarction ([place]).   She has a past surgical history that includes Cholecystectomy; Tubal ligation (2006); uterine ablation; and Breast enhancement surgery.   Her family history includes Arthritis in her mother; COPD in her mother.She reports that she has quit smoking. She quit smokeless tobacco use about 3 years ago. She reports that she drinks about 1.2 oz of alcohol per week. She reports that she does not use drugs.    ROS Review of Systems  Constitutional: Negative for activity change, appetite change and fever.  HENT: Negative for congestion, rhinorrhea and sore throat.   Eyes: Negative for visual disturbance.  Respiratory: Negative for cough and shortness of breath.   Cardiovascular: Negative for chest pain and palpitations.  Gastrointestinal: Negative for abdominal pain, diarrhea and nausea.  Genitourinary: Negative for dysuria.  Musculoskeletal: Positive for myalgias. Negative for arthralgias.    Objective:  BP 123/82   Pulse 72   Temp (!) 96.9 F (36.1 C) (Oral)   Ht 4' 11"  (1.499 m)   Wt 147 lb (66.7 kg)   BMI 29.69 kg/m   BP Readings from Last 3 Encounters:  05/21/17 123/82  03/07/17 139/81  02/21/17 126/79    Wt Readings from Last 3 Encounters:  05/21/17 147 lb (66.7 kg)  03/07/17 150 lb (68 kg)  02/21/17 150 lb (68 kg)     Physical Exam  Constitutional: She is oriented to person, place, and time. She appears well-developed  and well-nourished. No distress.  HENT:  Head: Normocephalic and atraumatic.  Right Ear: External ear normal.  Left Ear: External ear normal.  Nose: Nose normal.  Mouth/Throat: Oropharynx is clear and moist.  Eyes: Conjunctivae and EOM are normal. Pupils are equal, round, and reactive to light.  Neck: Normal range of motion. Neck supple. No thyromegaly present.  Cardiovascular: Normal rate, regular rhythm and normal heart sounds.  No murmur heard. Pulmonary/Chest:  Effort normal and breath sounds normal. No respiratory distress. She has no wheezes. She has no rales.  Abdominal: Soft. Bowel sounds are normal. She exhibits no distension. There is no tenderness.  Lymphadenopathy:    She has no cervical adenopathy.  Neurological: She is alert and oriented to person, place, and time. She has normal reflexes.  Skin: Skin is warm and dry.  Psychiatric: She has a normal mood and affect. Her behavior is normal. Judgment and thought content normal.      Assessment & Plan:   Josalynn was seen today for follow-up.  Diagnoses and all orders for this visit:  Asymptomatic HIV infection ([place]) -     Cancel: RNA, Real Time PCR (Graph) -     Cancel: Magnesium -     Cancel: Phosphorus -     VITAMIN D 25 Hydroxy (Vit-D Deficiency, Fractures) -     Urinalysis -     Magnesium -     Phosphorus -     RNA, Real Time PCR (Graph)  Essential hypertension -     Thyroid Panel With TSH -     Cancel: CBC with Differential/Platelet -     Cancel: CMP14+EGFR -     Cancel: CMP14+EGFR -     Cancel: CBC with Differential/Platelet -     CBC with Differential/Platelet -     CMP14+EGFR  Mixed hyperlipidemia -     Thyroid Panel With TSH -     Cancel: Lipid panel -     Cancel: CBC with Differential/Platelet -     Cancel: CMP14+EGFR -     Cancel: Lipid panel -     Lipid panel  Recurrent major depressive disorder, in full remission (HCC) -     Thyroid Panel With TSH -     Cancel: CBC with Differential/Platelet -     Cancel: CMP14+EGFR -     Thyroid Panel With TSH  Fatigue, unspecified type -     TSH  Other orders -     Lipid panel       I have discontinued Canesha Fabel's sertraline and mirtazapine. I am also having her maintain her aspirin, abacavir-dolutegravir-lamiVUDine, fluocinonide-emollient, traMADol, hydrOXYzine, amLODipine, atorvastatin, and ferrous sulfate.  Allergies as of 05/21/2017      Reactions   Efavirenz Dermatitis, Shortness Of Breath    Sulfa Antibiotics Rash   Cymbalta [duloxetine Hcl] Nausea Only   Causes insomnia   Nelfinavir    Other reaction(s): Other (See Comments) Mouth ulcers      Medication List        Accurate as of 05/21/17 11:59 PM. Always use your most recent med list.          amLODipine 5 MG tablet Commonly known as:  NORVASC TAKE 1 TABLET (5 MG TOTAL) BY MOUTH DAILY. FOR BLOOD PRESSURE   aspirin 81 MG chewable tablet Chew 81 mg by mouth.   atorvastatin 40 MG tablet Commonly known as:  LIPITOR TAKE 1 TABLETS (40 MG TOTAL) BY MOUTH DAILY.   ferrous sulfate 325 (65 FE)  MG tablet Take 325 mg by mouth.   fluocinonide-emollient 0.05 % cream Commonly known as:  LIDEX-E APPLY SPARINGLY TO AFFECTED AREAS TWICE DAILY   hydrOXYzine 50 MG capsule Commonly known as:  VISTARIL Take 1 capsule (50 mg total) by mouth 3 (three) times daily as needed.   traMADol 50 MG tablet Commonly known as:  ULTRAM Take 1 tablet (50 mg total) by mouth 4 (four) times daily as needed for moderate pain.   TRIUMEQ 600-50-300 MG tablet Generic drug:  abacavir-dolutegravir-lamiVUDine Take 1 tablet by mouth daily.        Follow-up: Return in about 3 months (around 08/19/2017).  Claretta Fraise, M.D.

## 2017-07-25 ENCOUNTER — Telehealth: Payer: Self-pay | Admitting: Family Medicine

## 2017-07-25 NOTE — Telephone Encounter (Signed)
Please  write and I will sign. Thanks, WS 

## 2017-07-25 NOTE — Telephone Encounter (Signed)
Please review

## 2017-07-25 NOTE — Telephone Encounter (Signed)
Needs a note stating that the med she was on with her suicide attempt is why she no longer takes that med. She does not know the name of it. Please advise

## 2017-07-25 NOTE — Telephone Encounter (Signed)
Letter written. Patient notified that letter left up front for pick up

## 2017-08-18 ENCOUNTER — Ambulatory Visit (INDEPENDENT_AMBULATORY_CARE_PROVIDER_SITE_OTHER): Payer: Managed Care, Other (non HMO) | Admitting: Family Medicine

## 2017-08-18 ENCOUNTER — Encounter: Payer: Self-pay | Admitting: Family Medicine

## 2017-08-18 VITALS — BP 108/78 | HR 70 | Temp 97.4°F | Ht 59.0 in | Wt 143.0 lb

## 2017-08-18 DIAGNOSIS — I10 Essential (primary) hypertension: Secondary | ICD-10-CM

## 2017-08-18 DIAGNOSIS — F3341 Major depressive disorder, recurrent, in partial remission: Secondary | ICD-10-CM

## 2017-08-18 DIAGNOSIS — E782 Mixed hyperlipidemia: Secondary | ICD-10-CM

## 2017-08-18 LAB — URINALYSIS
BILIRUBIN UA: NEGATIVE
GLUCOSE, UA: NEGATIVE
Ketones, UA: NEGATIVE
Leukocytes, UA: NEGATIVE
Nitrite, UA: NEGATIVE
PH UA: 6 (ref 5.0–7.5)
Protein, UA: NEGATIVE
RBC UA: NEGATIVE
Specific Gravity, UA: 1.01 (ref 1.005–1.030)
Urobilinogen, Ur: 0.2 mg/dL (ref 0.2–1.0)

## 2017-08-18 MED ORDER — ATORVASTATIN CALCIUM 40 MG PO TABS: ORAL_TABLET | ORAL | 5 refills | Status: DC

## 2017-08-18 MED ORDER — HYDROXYZINE PAMOATE 50 MG PO CAPS: 50.0000 mg | ORAL_CAPSULE | Freq: Three times a day (TID) | ORAL | 5 refills | Status: DC | PRN

## 2017-08-18 NOTE — Progress Notes (Signed)
Subjective:  Patient ID: Gina Alexander, female    DOB: 1971/04/13  Age: 47 y.o. MRN: [id number]  CC: Follow-up (pt here today for routine follow up of her chronic medical conditions)   HPI Gina Alexander presents for follow-up of hypertension. Patient has no history of headache chest pain or shortness of breath or recent cough. Patient also denies symptoms of TIA such as numbness weakness lateralizing. Patient checks  blood pressure at home and has not had any elevated readings recently. Patient denies side effects from his medication. States taking it regularly. Patient in for follow-up of elevated cholesterol. Doing well without complaints on current medication. Denies side effects of statin including myalgia and arthralgia and nausea. Also in today for liver function testing. Currently no chest pain, shortness of breath or other cardiovascular related symptoms noted.  Patient also occasionally takes tramadol for low back pain and sciatica.  This is a moderate pain that she does not usually treat unless he gets moderately severe.  Depression screen Gina Alexander 2/9 08/18/2017 05/21/2017 02/21/2017  Decreased Interest 0 0 0  Down, Depressed, Hopeless 0 0 1  PHQ - 2 Score 0 0 1  Altered sleeping - - 3  Tired, decreased energy - - 1  Change in appetite - - 3  Feeling bad or failure about yourself  - - 1  Trouble concentrating - - 0  Moving slowly or fidgety/restless - - 0  Suicidal thoughts - - 0  PHQ-9 Score - - 9  Difficult doing work/chores - - -    History Gina Alexander has a past medical history of HIV infection ([place]), Hyperlipidemia, and Myocardial infarction ([place]).   She has a past surgical history that includes Cholecystectomy; Tubal ligation (2006); uterine ablation; and Breast enhancement surgery.   Her family history includes Arthritis in her mother; COPD in her mother.She reports that she has quit smoking. She quit smokeless tobacco use about 3 years ago. She reports that she drinks  about 1.2 oz of alcohol per week. She reports that she does not use drugs.    ROS Review of Systems  Constitutional: Negative for activity change, appetite change and fever.  HENT: Negative for congestion, rhinorrhea and sore throat.   Eyes: Negative for visual disturbance.  Respiratory: Negative for cough and shortness of breath.   Cardiovascular: Negative for chest pain and palpitations.  Gastrointestinal: Negative for abdominal pain, diarrhea and nausea.  Genitourinary: Negative for dysuria.  Musculoskeletal: Negative for arthralgias and myalgias.    Objective:  BP 108/78   Pulse 70   Temp (!) 97.4 F (36.3 C) (Oral)   Ht 4' 11"  (1.499 m)   Wt 143 lb (64.9 kg)   BMI 28.88 kg/m   BP Readings from Last 3 Encounters:  08/18/17 108/78  05/21/17 123/82  03/07/17 139/81    Wt Readings from Last 3 Encounters:  08/18/17 143 lb (64.9 kg)  05/21/17 147 lb (66.7 kg)  03/07/17 150 lb (68 kg)     Physical Exam  Constitutional: She is oriented to person, place, and time. She appears well-developed and well-nourished. No distress.  HENT:  Head: Normocephalic and atraumatic.  Right Ear: External ear normal.  Left Ear: External ear normal.  Nose: Nose normal.  Mouth/Throat: Oropharynx is clear and moist.  Eyes: Conjunctivae and EOM are normal. Pupils are equal, round, and reactive to light.  Neck: Normal range of motion. Neck supple. No thyromegaly present.  Cardiovascular: Normal rate, regular rhythm and normal heart sounds.  No murmur heard.  Pulmonary/Chest: Effort normal and breath sounds normal. No respiratory distress. She has no wheezes. She has no rales.  Abdominal: Soft. Bowel sounds are normal. She exhibits no distension. There is no tenderness.  Lymphadenopathy:    She has no cervical adenopathy.  Neurological: She is alert and oriented to person, place, and time. She has normal reflexes.  Skin: Skin is warm and dry.  Psychiatric: She has a normal mood and affect.  Her behavior is normal. Judgment and thought content normal.      Assessment & Plan:   Tinea was seen today for follow-up.  Diagnoses and all orders for this visit:  Essential hypertension -     CBC with Differential/Platelet -     CMP14+EGFR -     Urinalysis  Recurrent major depressive disorder, in partial remission (HCC) -     Iron, TIBC and Ferritin Panel  Mixed hyperlipidemia -     Lipid panel  Other orders -     atorvastatin (LIPITOR) 40 MG tablet; TAKE 1 TABLETS (40 MG TOTAL) BY MOUTH DAILY. -     hydrOXYzine (VISTARIL) 50 MG capsule; Take 1 capsule (50 mg total) by mouth 3 (three) times daily as needed.       I am having Gina Alexander maintain her aspirin, abacavir-dolutegravir-lamiVUDine, fluocinonide-emollient, traMADol, amLODipine, ferrous sulfate, Vitamin D (Ergocalciferol), atorvastatin, and hydrOXYzine.  Allergies as of 08/18/2017      Reactions   Efavirenz Dermatitis, Shortness Of Breath   Sulfa Antibiotics Rash   Cymbalta [duloxetine Hcl] Nausea Only   Causes insomnia   Nelfinavir    Other reaction(s): Other (See Comments) Mouth ulcers      Medication List        Accurate as of 08/18/17  5:31 PM. Always use your most recent med list.          amLODipine 5 MG tablet Commonly known as:  NORVASC TAKE 1 TABLET (5 MG TOTAL) BY MOUTH DAILY. FOR BLOOD PRESSURE   aspirin 81 MG chewable tablet Chew 81 mg by mouth.   atorvastatin 40 MG tablet Commonly known as:  LIPITOR TAKE 1 TABLETS (40 MG TOTAL) BY MOUTH DAILY.   ferrous sulfate 325 (65 FE) MG tablet Take 325 mg by mouth.   fluocinonide-emollient 0.05 % cream Commonly known as:  LIDEX-E APPLY SPARINGLY TO AFFECTED AREAS TWICE DAILY   hydrOXYzine 50 MG capsule Commonly known as:  VISTARIL Take 1 capsule (50 mg total) by mouth 3 (three) times daily as needed.   traMADol 50 MG tablet Commonly known as:  ULTRAM Take 1 tablet (50 mg total) by mouth 4 (four) times daily as needed for  moderate pain.   TRIUMEQ 600-50-300 MG tablet Generic drug:  abacavir-dolutegravir-lamiVUDine Take 1 tablet by mouth daily.   Vitamin D (Ergocalciferol) 50000 units Caps capsule Commonly known as:  DRISDOL Take 1 capsule (50,000 Units total) by mouth 2 (two) times a week. For two month        Follow-up: Return in about 6 months (around 02/18/2018).  Claretta Fraise, M.D.

## 2017-08-19 LAB — CBC WITH DIFFERENTIAL/PLATELET
Basophils Absolute: 0.1 10*3/uL (ref 0.0–0.2)
Basos: 1 %
EOS (ABSOLUTE): 0.2 10*3/uL (ref 0.0–0.4)
EOS: 2 %
HEMATOCRIT: 44.2 % (ref 34.0–46.6)
HEMOGLOBIN: 15.1 g/dL (ref 11.1–15.9)
Immature Grans (Abs): 0 10*3/uL (ref 0.0–0.1)
Immature Granulocytes: 0 %
Lymphocytes Absolute: 4.3 10*3/uL — ABNORMAL HIGH (ref 0.7–3.1)
Lymphs: 35 %
MCH: 31.5 pg (ref 26.6–33.0)
MCHC: 34.2 g/dL (ref 31.5–35.7)
MCV: 92 fL (ref 79–97)
MONOCYTES: 7 %
Monocytes Absolute: 0.8 10*3/uL (ref 0.1–0.9)
NEUTROS ABS: 6.9 10*3/uL (ref 1.4–7.0)
Neutrophils: 55 %
Platelets: 258 10*3/uL (ref 150–379)
RBC: 4.79 x10E6/uL (ref 3.77–5.28)
RDW: 13.8 % (ref 12.3–15.4)
WBC: 12.2 10*3/uL — AB (ref 3.4–10.8)

## 2017-08-19 LAB — LIPID PANEL
CHOL/HDL RATIO: 4.6 ratio — AB (ref 0.0–4.4)
CHOLESTEROL TOTAL: 138 mg/dL (ref 100–199)
HDL: 30 mg/dL — ABNORMAL LOW (ref >39)
LDL Calculated: 33 mg/dL (ref 0–99)
Triglycerides: 373 mg/dL — ABNORMAL HIGH (ref 0–149)
VLDL Cholesterol Cal: 75 mg/dL — ABNORMAL HIGH (ref 5–40)

## 2017-08-19 LAB — IRON,TIBC AND FERRITIN PANEL
Ferritin: 185 ng/mL — ABNORMAL HIGH (ref 15–150)
IRON: 43 ug/dL (ref 27–159)
Iron Saturation: 16 % (ref 15–55)
TIBC: 268 ug/dL (ref 250–450)
UIBC: 225 ug/dL (ref 131–425)

## 2017-08-19 LAB — CMP14+EGFR
ALBUMIN: 4.6 g/dL (ref 3.5–5.5)
ALK PHOS: 125 IU/L — AB (ref 39–117)
ALT: 17 IU/L (ref 0–32)
AST: 13 IU/L (ref 0–40)
Albumin/Globulin Ratio: 1.5 (ref 1.2–2.2)
BUN / CREAT RATIO: 10 (ref 9–23)
BUN: 9 mg/dL (ref 6–24)
Bilirubin Total: <0.2 mg/dL (ref 0.0–1.2)
CO2: 25 mmol/L (ref 20–29)
CREATININE: 0.94 mg/dL (ref 0.57–1.00)
Calcium: 9.7 mg/dL (ref 8.7–10.2)
Chloride: 103 mmol/L (ref 96–106)
GFR, EST AFRICAN AMERICAN: 85 mL/min/{1.73_m2} (ref >59)
GFR, EST NON AFRICAN AMERICAN: 74 mL/min/{1.73_m2} (ref >59)
GLOBULIN, TOTAL: 3 g/dL (ref 1.5–4.5)
Glucose: 117 mg/dL — ABNORMAL HIGH (ref 65–99)
Potassium: 4.1 mmol/L (ref 3.5–5.2)
SODIUM: 140 mmol/L (ref 134–144)
Total Protein: 7.6 g/dL (ref 6.0–8.5)

## 2017-08-20 ENCOUNTER — Ambulatory Visit: Payer: Managed Care, Other (non HMO) | Admitting: Family Medicine

## 2017-08-26 ENCOUNTER — Other Ambulatory Visit: Payer: Self-pay | Admitting: *Deleted

## 2017-08-26 MED ORDER — AMLODIPINE BESYLATE 5 MG PO TABS: 5.0000 mg | ORAL_TABLET | Freq: Every day | ORAL | 1 refills | Status: DC

## 2017-09-22 ENCOUNTER — Encounter: Payer: Self-pay | Admitting: Family Medicine

## 2017-10-14 DIAGNOSIS — Z8619 Personal history of other infectious and parasitic diseases: Secondary | ICD-10-CM | POA: Diagnosis not present

## 2017-10-14 DIAGNOSIS — R87610 Atypical squamous cells of undetermined significance on cytologic smear of cervix (ASC-US): Secondary | ICD-10-CM | POA: Diagnosis not present

## 2017-10-24 DIAGNOSIS — Z1231 Encounter for screening mammogram for malignant neoplasm of breast: Secondary | ICD-10-CM | POA: Diagnosis not present

## 2017-11-14 DIAGNOSIS — Z7982 Long term (current) use of aspirin: Secondary | ICD-10-CM | POA: Diagnosis not present

## 2017-11-14 DIAGNOSIS — Z888 Allergy status to other drugs, medicaments and biological substances status: Secondary | ICD-10-CM | POA: Diagnosis not present

## 2017-11-14 DIAGNOSIS — Z79899 Other long term (current) drug therapy: Secondary | ICD-10-CM | POA: Diagnosis not present

## 2017-11-14 DIAGNOSIS — D649 Anemia, unspecified: Secondary | ICD-10-CM | POA: Diagnosis not present

## 2017-11-14 DIAGNOSIS — Z882 Allergy status to sulfonamides status: Secondary | ICD-10-CM | POA: Diagnosis not present

## 2017-11-14 DIAGNOSIS — Z95828 Presence of other vascular implants and grafts: Secondary | ICD-10-CM | POA: Diagnosis not present

## 2017-11-14 DIAGNOSIS — I10 Essential (primary) hypertension: Secondary | ICD-10-CM | POA: Diagnosis not present

## 2017-11-14 DIAGNOSIS — I252 Old myocardial infarction: Secondary | ICD-10-CM | POA: Diagnosis not present

## 2017-11-14 DIAGNOSIS — Z8679 Personal history of other diseases of the circulatory system: Secondary | ICD-10-CM | POA: Diagnosis not present

## 2017-11-14 DIAGNOSIS — D72829 Elevated white blood cell count, unspecified: Secondary | ICD-10-CM | POA: Diagnosis not present

## 2017-11-14 DIAGNOSIS — I2510 Atherosclerotic heart disease of native coronary artery without angina pectoris: Secondary | ICD-10-CM | POA: Diagnosis not present

## 2017-11-14 DIAGNOSIS — E785 Hyperlipidemia, unspecified: Secondary | ICD-10-CM | POA: Diagnosis not present

## 2017-11-14 DIAGNOSIS — R0789 Other chest pain: Secondary | ICD-10-CM | POA: Diagnosis not present

## 2017-11-14 DIAGNOSIS — R079 Chest pain, unspecified: Secondary | ICD-10-CM | POA: Diagnosis not present

## 2017-11-15 DIAGNOSIS — R079 Chest pain, unspecified: Secondary | ICD-10-CM | POA: Diagnosis not present

## 2017-11-16 ENCOUNTER — Encounter: Payer: Self-pay | Admitting: Family Medicine

## 2017-11-17 ENCOUNTER — Other Ambulatory Visit: Payer: Self-pay | Admitting: Family Medicine

## 2017-11-17 MED ORDER — VARENICLINE TARTRATE 0.5 MG X 11 & 1 MG X 42 PO MISC: ORAL | 0 refills | Status: DC

## 2017-11-21 ENCOUNTER — Other Ambulatory Visit: Payer: Self-pay | Admitting: *Deleted

## 2017-11-21 NOTE — Patient Outreach (Signed)
[facility]) Care Management  11/21/2017  Gina RenderHeather Alexander 1971-06-09 [id number]   Subjective: Telephone call to patient's home / mobile number, spoke with patient, and HIPAA verified.  Discussed [facility] [employer] Transition of care follow up, patient voiced understanding, and is in agreement to follow up.   Patient states she is doing good, is planning to call primary provider to request a sooner appointment with the cardiologist, and is currently scheduled to see cardiologist on 01/14/18.  Patient states she is able to manage self care and has assistance as needed with activities of daily living / home management.   Patient voices understanding of medical diagnosis and treatment plan.  States she is accessing her [employer]uerbach[employer]ign[employer] benefits as needed via member services number on back of card or through [email or web address].  Patient states she does not have any education material, transition of care, care coordination, disease management, disease monitoring, transportation, community resource, or pharmacy needs at this time.  States she is very appreciative of the follow up and is in agreement to receive [facility] information.       Objective: Per KPN (Knowledge Performance Now, point of care tool), [employer] iCollaborate, and chart review, patient hospitalized  11/14/17 - 11/15/17 for chest pain.   Patient also has a history of CAD, HIV infection, and hyperlipidemia.      Assessment: Received [employer] Transition of care referral on 11/20/17.   Transition of care follow up completed, no care management needs, and will proceed with case closure.       Plan: RNCM will send patient successful outreach letter, [facility] pamphlet, and magnet. RNCM will complete case closure due to follow up completed / no care management needs.        Gina Aube H. Gardiner Barefootooper RN, BSN, CCM [facility] Care [facility] Telephonic CM Phone: [phone] Fax: [phone]

## 2018-01-30 DIAGNOSIS — R05 Cough: Secondary | ICD-10-CM | POA: Diagnosis not present

## 2018-01-30 DIAGNOSIS — Z720 Tobacco use: Secondary | ICD-10-CM | POA: Diagnosis not present

## 2018-02-10 DIAGNOSIS — J029 Acute pharyngitis, unspecified: Secondary | ICD-10-CM | POA: Diagnosis not present

## 2018-02-10 DIAGNOSIS — H6982 Other specified disorders of Eustachian tube, left ear: Secondary | ICD-10-CM | POA: Diagnosis not present

## 2018-02-10 DIAGNOSIS — H6692 Otitis media, unspecified, left ear: Secondary | ICD-10-CM | POA: Diagnosis not present

## 2018-02-17 ENCOUNTER — Telehealth: Payer: Self-pay | Admitting: Family Medicine

## 2018-02-18 ENCOUNTER — Ambulatory Visit (INDEPENDENT_AMBULATORY_CARE_PROVIDER_SITE_OTHER): Payer: Managed Care, Other (non HMO) | Admitting: Family Medicine

## 2018-02-18 ENCOUNTER — Encounter: Payer: Self-pay | Admitting: Family Medicine

## 2018-02-18 VITALS — BP 119/81 | HR 73 | Temp 97.7°F | Ht 59.0 in | Wt 143.4 lb

## 2018-02-18 DIAGNOSIS — Z23 Encounter for immunization: Secondary | ICD-10-CM

## 2018-02-18 DIAGNOSIS — M79641 Pain in right hand: Secondary | ICD-10-CM

## 2018-02-18 DIAGNOSIS — I10 Essential (primary) hypertension: Secondary | ICD-10-CM

## 2018-02-18 DIAGNOSIS — Z21 Asymptomatic human immunodeficiency virus [HIV] infection status: Secondary | ICD-10-CM

## 2018-02-18 DIAGNOSIS — Z20828 Contact with and (suspected) exposure to other viral communicable diseases: Secondary | ICD-10-CM

## 2018-02-18 DIAGNOSIS — E782 Mixed hyperlipidemia: Secondary | ICD-10-CM | POA: Diagnosis not present

## 2018-02-18 DIAGNOSIS — M79642 Pain in left hand: Secondary | ICD-10-CM

## 2018-02-18 MED ORDER — TRAMADOL HCL 50 MG PO TABS: 50.0000 mg | ORAL_TABLET | Freq: Four times a day (QID) | ORAL | 5 refills | Status: DC | PRN

## 2018-02-18 MED ORDER — ATORVASTATIN CALCIUM 40 MG PO TABS: ORAL_TABLET | ORAL | 5 refills | Status: DC

## 2018-02-18 MED ORDER — AMLODIPINE BESYLATE 5 MG PO TABS: 5.0000 mg | ORAL_TABLET | Freq: Every day | ORAL | 1 refills | Status: DC

## 2018-02-18 MED ORDER — FLUOCINONIDE-E 0.05 % EX CREA: TOPICAL_CREAM | CUTANEOUS | 5 refills | Status: DC

## 2018-02-18 NOTE — Progress Notes (Signed)
Subjective:  Patient ID: Gina Alexander, female    DOB: 1971-04-13  Age: 47 y.o. MRN: [id number]  CC: Medical Management of Chronic Issues   HPI Mikeisha Lemonds presents for  follow-up of hypertension. Patient has no history of headache chest pain or shortness of breath or recent cough. Patient also denies symptoms of TIA such as focal numbness or weakness. Patient denies side effects from medication. States taking it regularly.  Patient in for follow-up of elevated cholesterol. Doing well without complaints on current medication. Denies side effects of statin including myalgia and arthralgia and nausea. Also in today for liver function testing. Currently no chest pain, shortness of breath or other cardiovascular related symptoms noted.  Patient has HIV infection but has been followed with [facility] and is been maintaining an undetectable level for her viral load testing.  Patient also says that she now has overnight visits with her daughters allowed by social services.  They go back in about a month to consider a 40-monthcustody and if that works out she will be eligible for full custody again. History HShakiyahas a past medical history of Depression, [place] ([place], Hyperlipidemia, Hypertension, and Myocardial infarction [place].   She has a past surgical history that includes Cholecystectomy; Tubal ligation (2006); uterine ablation; and Breast enhancement surgery.   Her family history includes Arthritis in her mother; COPD in her mother.She reports that she has been smoking. She has been smoking about 0.50 packs per day. She quit smokeless tobacco use about 4 years ago. She reports that she drinks about 2.0 standard drinks of alcohol per week. She reports that she does not use drugs.  Current Outpatient Medications on File Prior to Visit  Medication Sig Dispense Refill  . Abacavir-Dolutegravir-Lamivud (TRIUMEQ) 600-50-300 MG TABS Take 1 tablet by mouth daily.      .Marland Kitchenaspirin 81 MG chewable tablet Chew 81 mg by mouth.     No current facility-administered medications on file prior to visit.     ROS Review of Systems  Constitutional: Negative.   HENT: Positive for congestion (cold symptoms for a few days.).   Eyes: Negative for visual disturbance.  Respiratory: Negative for shortness of breath.   Cardiovascular: Negative for chest pain.  Gastrointestinal: Negative for abdominal pain, constipation, diarrhea, nausea and vomiting.  Genitourinary: Negative for difficulty urinating.  Musculoskeletal: Negative for arthralgias and myalgias.  Allergic/Immunologic:       Kissed her daughter today and then found out daughter tested positive for mono  Neurological: Negative for headaches.  Psychiatric/Behavioral: Negative for sleep disturbance.    Objective:  BP 119/81   Pulse 73   Temp 97.7 F (36.5 C) (Oral)   Ht 4' 11"  (1.499 m)   Wt 143 lb 6 oz (65 kg)   BMI 28.96 kg/m   BP Readings from Last 3 Encounters:  02/18/18 119/81  08/18/17 108/78  05/21/17 123/82    Wt Readings from Last 3 Encounters:  02/18/18 143 lb 6 oz (65 kg)  08/18/17 143 lb (64.9 kg)  05/21/17 147 lb (66.7 kg)     Physical Exam  Constitutional: She is oriented to person, place, and time. She appears well-developed and well-nourished. No distress.  HENT:  Head: Normocephalic and atraumatic.  Right Ear: External ear normal.  Left Ear: External ear normal.  Nose: Nose normal.  Mouth/Throat: Oropharynx is clear and moist.  Eyes: Pupils are equal, round, and reactive to light. Conjunctivae and EOM are normal.  Neck: Normal range  of motion. Neck supple. No thyromegaly present.  Cardiovascular: Normal rate, regular rhythm and normal heart sounds.  No murmur heard. Pulmonary/Chest: Effort normal and breath sounds normal. No respiratory distress. She has no wheezes. She has no rales.  Abdominal: Soft. Bowel sounds are normal. She exhibits no distension. There is no  tenderness.  Lymphadenopathy:    She has no cervical adenopathy.  Neurological: She is alert and oriented to person, place, and time. She has normal reflexes.  Skin: Skin is warm and dry.  Psychiatric: She has a normal mood and affect. Her behavior is normal. Judgment and thought content normal.      Assessment & Plan:   Irmalee was seen today for medical management of chronic issues.  Diagnoses and all orders for this visit:  Essential hypertension -     CBC with Differential/Platelet -     CMP14+EGFR  Mixed hyperlipidemia -     CBC with Differential/Platelet -     CMP14+EGFR  Asymptomatic HIV infection ([place]) -     CBC with Differential/Platelet -     CMP14+EGFR  Exposure to mononucleosis syndrome -     Epstein-Barr virus VCA antibody panel -     CBC with Differential/Platelet -     CMP14+EGFR  Bilateral hand pain -     CBC with Differential/Platelet -     CMP14+EGFR -     DG Hand Complete Left; Future -     DG Hand Complete Right; Future  Other orders -     amLODipine (NORVASC) 5 MG tablet; Take 1 tablet (5 mg total) by mouth daily. For blood pressure -     atorvastatin (LIPITOR) 40 MG tablet; TAKE 1 TABLETS (40 MG TOTAL) BY MOUTH DAILY. -     fluocinonide-emollient (LIDEX-E) 0.05 % cream; APPLY SPARINGLY TO AFFECTED AREAS TWICE DAILY -     Discontinue: traMADol (ULTRAM) 50 MG tablet; Take 1 tablet (50 mg total) by mouth 4 (four) times daily as needed for moderate pain. -     traMADol (ULTRAM) 50 MG tablet; Take 1 tablet (50 mg total) by mouth 4 (four) times daily as needed for moderate pain. -     Pneumococcal conjugate vaccine 13-valent   Allergies as of 02/18/2018      Reactions   Efavirenz Dermatitis, Shortness Of Breath   Sulfa Antibiotics Rash   Cymbalta [duloxetine Hcl] Nausea Only   Causes insomnia   Nelfinavir    Other reaction(s): Other (See Comments) Mouth ulcers   Duloxetine Nausea Only      Medication List        Accurate as of 02/18/18   5:31 PM. Always use your most recent med list.          amLODipine 5 MG tablet Commonly known as:  NORVASC Take 1 tablet (5 mg total) by mouth daily. For blood pressure   aspirin 81 MG chewable tablet Chew 81 mg by mouth.   atorvastatin 40 MG tablet Commonly known as:  LIPITOR TAKE 1 TABLETS (40 MG TOTAL) BY MOUTH DAILY.   fluocinonide-emollient 0.05 % cream Commonly known as:  LIDEX-E APPLY SPARINGLY TO AFFECTED AREAS TWICE DAILY   traMADol 50 MG tablet Commonly known as:  ULTRAM Take 1 tablet (50 mg total) by mouth 4 (four) times daily as needed for moderate pain.   TRIUMEQ 600-50-300 MG tablet Generic drug:  abacavir-dolutegravir-lamiVUDine Take 1 tablet by mouth daily.       Meds ordered this encounter  Medications  . amLODipine (  NORVASC) 5 MG tablet    Sig: Take 1 tablet (5 mg total) by mouth daily. For blood pressure    Dispense:  90 tablet    Refill:  1  . atorvastatin (LIPITOR) 40 MG tablet    Sig: TAKE 1 TABLETS (40 MG TOTAL) BY MOUTH DAILY.    Dispense:  30 tablet    Refill:  5  . fluocinonide-emollient (LIDEX-E) 0.05 % cream    Sig: APPLY SPARINGLY TO AFFECTED AREAS TWICE DAILY    Dispense:  30 g    Refill:  5  . DISCONTD: traMADol (ULTRAM) 50 MG tablet    Sig: Take 1 tablet (50 mg total) by mouth 4 (four) times daily as needed for moderate pain.    Dispense:  30 tablet    Refill:  5  . traMADol (ULTRAM) 50 MG tablet    Sig: Take 1 tablet (50 mg total) by mouth 4 (four) times daily as needed for moderate pain.    Dispense:  30 tablet    Refill:  5    Patient's past problems with depression have been related very much to the marital struggles.  Those seem to be resolving and she is in a very good place and now off of antidepressant medication.  She is working on IT trainer for smoking sensation currently.  She is not taking Chantix due to the expense, $100 per month since she is also taking HIV medicine for which her out-of-pocket  is $200 per month.  Follow-up: Return in about 6 months (around 08/19/2018).  Claretta Fraise, M.D.

## 2018-02-19 LAB — CBC WITH DIFFERENTIAL/PLATELET
BASOS: 0 %
Basophils Absolute: 0 10*3/uL (ref 0.0–0.2)
EOS (ABSOLUTE): 0.3 10*3/uL (ref 0.0–0.4)
Eos: 2 %
Hematocrit: 41.1 % (ref 34.0–46.6)
Hemoglobin: 14.5 g/dL (ref 11.1–15.9)
IMMATURE GRANS (ABS): 0.1 10*3/uL (ref 0.0–0.1)
IMMATURE GRANULOCYTES: 1 %
LYMPHS: 38 %
Lymphocytes Absolute: 5 10*3/uL — ABNORMAL HIGH (ref 0.7–3.1)
MCH: 32.7 pg (ref 26.6–33.0)
MCHC: 35.3 g/dL (ref 31.5–35.7)
MCV: 93 fL (ref 79–97)
Monocytes Absolute: 0.9 10*3/uL (ref 0.1–0.9)
Monocytes: 7 %
NEUTROS PCT: 52 %
Neutrophils Absolute: 7 10*3/uL (ref 1.4–7.0)
PLATELETS: 258 10*3/uL (ref 150–450)
RBC: 4.44 x10E6/uL (ref 3.77–5.28)
RDW: 13.8 % (ref 12.3–15.4)
WBC: 13.3 10*3/uL — ABNORMAL HIGH (ref 3.4–10.8)

## 2018-02-19 LAB — EPSTEIN-BARR VIRUS VCA ANTIBODY PANEL
EBV Early Antigen Ab, IgG: 56.8 U/mL — ABNORMAL HIGH (ref 0.0–8.9)
EBV NA IgG: <18 U/mL (ref 0.0–17.9)
EBV VCA IgG: >600 U/mL — ABNORMAL HIGH (ref 0.0–17.9)
EBV VCA IgM: <36 U/mL (ref 0.0–35.9)

## 2018-02-19 LAB — CMP14+EGFR
A/G RATIO: 1.7 (ref 1.2–2.2)
ALT: 23 IU/L (ref 0–32)
AST: 19 IU/L (ref 0–40)
Albumin: 4.3 g/dL (ref 3.5–5.5)
Alkaline Phosphatase: 102 IU/L (ref 39–117)
BUN/Creatinine Ratio: 20 (ref 9–23)
BUN: 14 mg/dL (ref 6–24)
Bilirubin Total: <0.2 mg/dL (ref 0.0–1.2)
CALCIUM: 9.6 mg/dL (ref 8.7–10.2)
CO2: 22 mmol/L (ref 20–29)
CREATININE: 0.7 mg/dL (ref 0.57–1.00)
Chloride: 103 mmol/L (ref 96–106)
GFR, EST AFRICAN AMERICAN: 121 mL/min/{1.73_m2} (ref >59)
GFR, EST NON AFRICAN AMERICAN: 105 mL/min/{1.73_m2} (ref >59)
GLUCOSE: 108 mg/dL — AB (ref 65–99)
Globulin, Total: 2.6 g/dL (ref 1.5–4.5)
Potassium: 3.8 mmol/L (ref 3.5–5.2)
Sodium: 138 mmol/L (ref 134–144)
TOTAL PROTEIN: 6.9 g/dL (ref 6.0–8.5)

## 2018-02-20 NOTE — Telephone Encounter (Signed)
Pt was here on 9/11

## 2018-05-13 DIAGNOSIS — R6889 Other general symptoms and signs: Secondary | ICD-10-CM | POA: Diagnosis not present

## 2018-05-13 DIAGNOSIS — B349 Viral infection, unspecified: Secondary | ICD-10-CM | POA: Diagnosis not present

## 2018-08-19 ENCOUNTER — Encounter: Payer: Self-pay | Admitting: Family Medicine

## 2018-08-19 ENCOUNTER — Ambulatory Visit (INDEPENDENT_AMBULATORY_CARE_PROVIDER_SITE_OTHER): Payer: Managed Care, Other (non HMO) | Admitting: Family Medicine

## 2018-08-19 ENCOUNTER — Other Ambulatory Visit: Payer: Self-pay

## 2018-08-19 VITALS — BP 139/80 | HR 57 | Temp 97.4°F | Ht 59.0 in | Wt 148.1 lb

## 2018-08-19 DIAGNOSIS — I10 Essential (primary) hypertension: Secondary | ICD-10-CM

## 2018-08-19 DIAGNOSIS — Z21 Asymptomatic human immunodeficiency virus [HIV] infection status: Secondary | ICD-10-CM | POA: Diagnosis not present

## 2018-08-19 DIAGNOSIS — M5432 Sciatica, left side: Secondary | ICD-10-CM

## 2018-08-19 DIAGNOSIS — E782 Mixed hyperlipidemia: Secondary | ICD-10-CM | POA: Diagnosis not present

## 2018-08-19 DIAGNOSIS — F1910 Other psychoactive substance abuse, uncomplicated: Secondary | ICD-10-CM | POA: Diagnosis not present

## 2018-08-19 DIAGNOSIS — M5431 Sciatica, right side: Secondary | ICD-10-CM

## 2018-08-19 MED ORDER — PREGABALIN 50 MG PO CAPS: ORAL_CAPSULE | ORAL | 1 refills | Status: DC

## 2018-08-19 MED ORDER — AMLODIPINE BESYLATE 5 MG PO TABS: 5.0000 mg | ORAL_TABLET | Freq: Every day | ORAL | 1 refills | Status: DC

## 2018-08-19 MED ORDER — ATORVASTATIN CALCIUM 40 MG PO TABS: ORAL_TABLET | ORAL | 1 refills | Status: DC

## 2018-08-19 MED ORDER — TRAMADOL HCL 50 MG PO TABS: 50.0000 mg | ORAL_TABLET | Freq: Four times a day (QID) | ORAL | 5 refills | Status: DC | PRN

## 2018-08-19 NOTE — Progress Notes (Signed)
Subjective:  Patient ID: Gina Alexander, female    DOB: [birth year]  Age: 48 y.o. MRN: [id number]  CC: Medical Management of Chronic Issues   HPI Gina Alexander presents for  follow-up of hypertension. Patient has no history of headache chest pain or shortness of breath or recent cough. Patient also denies symptoms of TIA such as focal numbness or weakness. Patient denies side effects from medication. States taking it regularly.  Additionally the patient is followed for her HIV at [facility].  She tells me that her CD4 counts are above 400 and her viral load is undetectable on her current regimen.  Patient in for follow-up of elevated cholesterol. Doing well without complaints on current medication. Denies side effects of statin including myalgia and arthralgia and nausea. Also in today for liver function testing. Currently no chest pain, shortness of breath or other cardiovascular related symptoms noted.  Patient reports that her sciatica has been flaring somewhat and she needs refills on her tramadol. History Gina Alexander has a past medical history of Depression, HIV infection (HCC), Hyperlipidemia, Hypertension, and Myocardial infarction (HCC).   She has a past surgical history that includes Cholecystectomy; Tubal ligation (2006); uterine ablation; and Breast enhancement surgery.   Her family history includes Arthritis in her mother; COPD in her mother.She reports that she has been smoking. She has been smoking about 0.50 packs per day. She quit smokeless tobacco use about 4 years ago. She reports current alcohol use of about 2.0 standard drinks of alcohol per week. She reports that she does not use drugs.  Current Outpatient Medications on File Prior to Visit  Medication Sig Dispense Refill  . Abacavir-Dolutegravir-Lamivud (TRIUMEQ) 600-50-300 MG TABS Take 1 tablet by mouth daily.     Marland Kitchen aspirin 81 MG chewable tablet Chew 81 mg by mouth.    . fluocinonide-emollient (LIDEX-E) 0.05 % cream  APPLY SPARINGLY TO AFFECTED AREAS TWICE DAILY 30 g 5   No current facility-administered medications on file prior to visit.     ROS Review of Systems  Constitutional: Negative.   HENT: Negative for congestion.   Eyes: Negative for visual disturbance.  Respiratory: Negative for shortness of breath.   Cardiovascular: Negative for chest pain.  Gastrointestinal: Negative for abdominal pain, constipation, diarrhea, nausea and vomiting.  Genitourinary: Negative for difficulty urinating.  Musculoskeletal: Negative for arthralgias and myalgias.  Neurological: Negative for headaches.  Psychiatric/Behavioral: Negative for sleep disturbance.    Objective:  BP 139/80   Pulse (!) 57   Temp (!) 97.4 F (36.3 C) (Oral)   Ht 4\' 11"  (1.499 m)   Wt 148 lb 2 oz (67.2 kg)   BMI 29.92 kg/m   BP Readings from Last 3 Encounters:  08/19/18 139/80  02/18/18 119/81  08/18/17 108/78    Wt Readings from Last 3 Encounters:  08/19/18 148 lb 2 oz (67.2 kg)  02/18/18 143 lb 6 oz (65 kg)  08/18/17 143 lb (64.9 kg)     Physical Exam Constitutional:      General: She is not in acute distress.    Appearance: She is well-developed.  HENT:     Head: Normocephalic and atraumatic.     Right Ear: External ear normal.     Left Ear: External ear normal.     Nose: Nose normal.  Eyes:     Conjunctiva/sclera: Conjunctivae normal.     Pupils: Pupils are equal, round, and reactive to light.  Neck:     Musculoskeletal: Normal range of motion and neck supple.  Thyroid: No thyromegaly.  Cardiovascular:     Rate and Rhythm: Normal rate and regular rhythm.     Heart sounds: Normal heart sounds. No murmur.  Pulmonary:     Effort: Pulmonary effort is normal. No respiratory distress.     Breath sounds: Normal breath sounds. No wheezing or rales.  Abdominal:     General: Bowel sounds are normal. There is no distension.     Palpations: Abdomen is soft.     Tenderness: There is no abdominal tenderness.   Lymphadenopathy:     Cervical: No cervical adenopathy.  Skin:    General: Skin is warm and dry.  Neurological:     Mental Status: She is alert and oriented to person, place, and time.     Deep Tendon Reflexes: Reflexes are normal and symmetric.  Psychiatric:        Behavior: Behavior normal.        Thought Content: Thought content normal.        Judgment: Judgment normal.       Assessment & Plan:   Gina Alexander was seen today for medical management of chronic issues.  Diagnoses and all orders for this visit:  Essential hypertension  Substance abuse (HCC) -     ToxASSURE Select 13 (MW), Urine  Asymptomatic HIV infection (HCC)  Mixed hyperlipidemia  Bilateral sciatica  Other orders -     amLODipine (NORVASC) 5 MG tablet; Take 1 tablet (5 mg total) by mouth daily. For blood pressure -     atorvastatin (LIPITOR) 40 MG tablet; TAKE 1 TABLETS (40 MG TOTAL) BY MOUTH DAILY. -     traMADol (ULTRAM) 50 MG tablet; Take 1 tablet (50 mg total) by mouth 4 (four) times daily as needed for moderate pain. -     pregabalin (LYRICA) 50 MG capsule; 1 qhs X7 days , then 2 qhs X 7d, then 3 qhs X 7d, then 4 qhs   Allergies as of 08/19/2018      Reactions   Efavirenz Dermatitis, Shortness Of Breath   Sulfa Antibiotics Rash   Cymbalta [duloxetine Hcl] Nausea Only   Causes insomnia   Nelfinavir    Other reaction(s): Other (See Comments) Mouth ulcers   Duloxetine Nausea Only      Medication List       Accurate as of August 19, 2018 11:59 PM. Always use your most recent med list.        amLODipine 5 MG tablet Commonly known as:  NORVASC Take 1 tablet (5 mg total) by mouth daily. For blood pressure   aspirin 81 MG chewable tablet Chew 81 mg by mouth.   atorvastatin 40 MG tablet Commonly known as:  LIPITOR TAKE 1 TABLETS (40 MG TOTAL) BY MOUTH DAILY.   fluocinonide-emollient 0.05 % cream Commonly known as:  LIDEX-E APPLY SPARINGLY TO AFFECTED AREAS TWICE DAILY   pregabalin 50 MG  capsule Commonly known as:  Lyrica 1 qhs X7 days , then 2 qhs X 7d, then 3 qhs X 7d, then 4 qhs   traMADol 50 MG tablet Commonly known as:  ULTRAM Take 1 tablet (50 mg total) by mouth 4 (four) times daily as needed for moderate pain.   Triumeq 600-50-300 MG tablet Generic drug:  abacavir-dolutegravir-lamiVUDine Take 1 tablet by mouth daily.       Meds ordered this encounter  Medications  . amLODipine (NORVASC) 5 MG tablet    Sig: Take 1 tablet (5 mg total) by mouth daily. For blood pressure  Dispense:  90 tablet    Refill:  1  . atorvastatin (LIPITOR) 40 MG tablet    Sig: TAKE 1 TABLETS (40 MG TOTAL) BY MOUTH DAILY.    Dispense:  90 tablet    Refill:  1  . traMADol (ULTRAM) 50 MG tablet    Sig: Take 1 tablet (50 mg total) by mouth 4 (four) times daily as needed for moderate pain.    Dispense:  30 tablet    Refill:  5  . pregabalin (LYRICA) 50 MG capsule    Sig: 1 qhs X7 days , then 2 qhs X 7d, then 3 qhs X 7d, then 4 qhs    Dispense:  120 capsule    Refill:  1    Pain agreement and drug screen completed.  Follow-up: Return in about 6 months (around 02/19/2019).  Mechele Claude, M.D.

## 2018-08-24 LAB — TOXASSURE SELECT 13 (MW), URINE

## 2018-08-25 ENCOUNTER — Encounter: Payer: Self-pay | Admitting: Family Medicine

## 2018-08-25 ENCOUNTER — Other Ambulatory Visit: Payer: Self-pay | Admitting: Family Medicine

## 2018-08-27 DIAGNOSIS — R05 Cough: Secondary | ICD-10-CM | POA: Diagnosis not present

## 2018-10-19 ENCOUNTER — Other Ambulatory Visit: Payer: Self-pay

## 2018-10-19 ENCOUNTER — Ambulatory Visit (INDEPENDENT_AMBULATORY_CARE_PROVIDER_SITE_OTHER): Payer: Managed Care, Other (non HMO) | Admitting: Family Medicine

## 2018-10-19 ENCOUNTER — Encounter: Payer: Self-pay | Admitting: Family Medicine

## 2018-10-19 DIAGNOSIS — R1033 Periumbilical pain: Secondary | ICD-10-CM | POA: Diagnosis not present

## 2018-10-19 MED ORDER — DIPHENOXYLATE-ATROPINE 2.5-0.025 MG PO TABS: 2.0000 | ORAL_TABLET | Freq: Four times a day (QID) | ORAL | 0 refills | Status: DC | PRN

## 2018-10-19 NOTE — Progress Notes (Signed)
Subjective:    Patient ID: Gina Alexander, female    DOB: [birth year], 48 y.o.   MRN: [id number]   HPI: Gina Alexander is a 48 y.o. female presenting for abdominal pains. Onset periumbilical pain yesterday morning. Lots of gas. Horrible diarrhea. With standing feels periumbilical pressure. Feels better if she pushes in on it. Having stabbing crampy pain. 10/10 with each occurrence. Eating causes pain to come back. Lasts 30 seconds to two minutes. Yesterday about 5 minutes apart. No fever. A little nauseated yesterday. None today. Hungry but scared to eat, because pains come with eating    Depression screen [facility] 2/9 08/19/2018 08/18/2017 05/21/2017 02/21/2017 01/31/2017  Decreased Interest 0 0 0 0 2  Down, Depressed, Hopeless 0 0 0 1 1  PHQ - 2 Score 0 0 0 1 3  Altered sleeping - - - 3 3  Tired, decreased energy - - - 1 3  Change in appetite - - - 3 3  Feeling bad or failure about yourself  - - - 1 3  Trouble concentrating - - - 0 3  Moving slowly or fidgety/restless - - - 0 0  Suicidal thoughts - - - 0 1  PHQ-9 Score - - - 9 19  Difficult doing work/chores - - - - -     Relevant past medical, surgical, family and social history reviewed and updated as indicated.  Interim medical history since our last visit reviewed. Allergies and medications reviewed and updated.  ROS:  Review of Systems   Social History   Tobacco Use  Smoking Status Current Every Day Smoker  . Packs/day: 0.50  Smokeless Tobacco Former Neurosurgeon  . Quit date: 01/11/2014       Objective:     Wt Readings from Last 3 Encounters:  08/19/18 148 lb 2 oz (67.2 kg)  02/18/18 143 lb 6 oz (65 kg)  08/18/17 143 lb (64.9 kg)     Exam deferred. Pt. Harboring due to COVID 19. Phone visit performed.   Assessment & Plan:   1. Periumbilical abdominal pain     Meds ordered this encounter  Medications  . diphenoxylate-atropine (LOMOTIL) 2.5-0.025 MG tablet    Sig: Take 2 tablets by mouth 4 (four) times daily as  needed for diarrhea or loose stools.    Dispense:  30 tablet    Refill:  0    No orders of the defined types were placed in this encounter.     Diagnoses and all orders for this visit:  Periumbilical abdominal pain  Other orders -     diphenoxylate-atropine (LOMOTIL) 2.5-0.025 MG tablet; Take 2 tablets by mouth 4 (four) times daily as needed for diarrhea or loose stools.    Virtual Visit via telephone Note  I discussed the limitations, risks, security and privacy concerns of performing an evaluation and management service by telephone and the availability of in person appointments. The patient was identified with two identifiers. Pt.expressed understanding and agreed to proceed. Pt. Is at home. Dr. Darlyn Read is in his office.  Follow Up Instructions:   I discussed the assessment and treatment plan with the patient. The patient was provided an opportunity to ask questions and all were answered. The patient agreed with the plan and demonstrated an understanding of the instructions.   The patient was advised to call back or seek an in-person evaluation if the symptoms worsen or if the condition fails to improve as anticipated.   Total minutes including chart review and phone contact  time: 18   Follow up plan: Return in about 2 days (around 10/21/2018).  Mechele ClaudeWarren Dajuan Turnley, MD [facility]

## 2018-12-18 ENCOUNTER — Encounter: Payer: Self-pay | Admitting: Family Medicine

## 2018-12-21 ENCOUNTER — Other Ambulatory Visit: Payer: Self-pay | Admitting: Family Medicine

## 2018-12-21 MED ORDER — ALPRAZOLAM 0.5 MG PO TABS: 0.5000 mg | ORAL_TABLET | Freq: Two times a day (BID) | ORAL | 0 refills | Status: DC | PRN

## 2018-12-25 ENCOUNTER — Encounter: Payer: Self-pay | Admitting: *Deleted

## 2019-01-06 DIAGNOSIS — R030 Elevated blood-pressure reading, without diagnosis of hypertension: Secondary | ICD-10-CM | POA: Diagnosis not present

## 2019-01-06 DIAGNOSIS — G2581 Restless legs syndrome: Secondary | ICD-10-CM | POA: Diagnosis not present

## 2019-01-06 DIAGNOSIS — Z79899 Other long term (current) drug therapy: Secondary | ICD-10-CM | POA: Diagnosis not present

## 2019-01-06 DIAGNOSIS — Z9889 Other specified postprocedural states: Secondary | ICD-10-CM | POA: Diagnosis not present

## 2019-01-06 DIAGNOSIS — I2510 Atherosclerotic heart disease of native coronary artery without angina pectoris: Secondary | ICD-10-CM | POA: Diagnosis not present

## 2019-02-17 ENCOUNTER — Other Ambulatory Visit: Payer: Self-pay

## 2019-02-18 ENCOUNTER — Ambulatory Visit (INDEPENDENT_AMBULATORY_CARE_PROVIDER_SITE_OTHER): Payer: Managed Care, Other (non HMO) | Admitting: Family Medicine

## 2019-02-18 ENCOUNTER — Encounter: Payer: Self-pay | Admitting: Family Medicine

## 2019-02-18 VITALS — BP 127/87 | HR 65 | Temp 98.6°F | Resp 18 | Ht 59.0 in | Wt 127.0 lb

## 2019-02-18 DIAGNOSIS — E782 Mixed hyperlipidemia: Secondary | ICD-10-CM | POA: Diagnosis not present

## 2019-02-18 DIAGNOSIS — I10 Essential (primary) hypertension: Secondary | ICD-10-CM | POA: Diagnosis not present

## 2019-02-18 DIAGNOSIS — R5383 Other fatigue: Secondary | ICD-10-CM | POA: Diagnosis not present

## 2019-02-19 ENCOUNTER — Ambulatory Visit: Payer: Managed Care, Other (non HMO) | Admitting: Family Medicine

## 2019-02-19 MED ORDER — AMLODIPINE BESYLATE 5 MG PO TABS: 5.0000 mg | ORAL_TABLET | Freq: Every day | ORAL | 1 refills | Status: DC

## 2019-02-19 MED ORDER — ATORVASTATIN CALCIUM 40 MG PO TABS: ORAL_TABLET | ORAL | 1 refills | Status: DC

## 2019-02-23 ENCOUNTER — Encounter: Payer: Self-pay | Admitting: Family Medicine

## 2019-02-23 NOTE — Progress Notes (Signed)
Subjective:  Patient ID: Gina Alexander, female    DOB: 02/09/1971  Age: 48 y.o. MRN: [id number]  CC: Medical Management of Chronic Issues and Hyperlipidemia   HPI [name] presents for  follow-up of hypertension. Patient has no history of headache chest pain or shortness of breath or recent cough. Patient also denies symptoms of TIA such as focal numbness or weakness. Patient denies side effects from medication. States taking it regularly.  in for follow-up of elevated cholesterol. Doing well without complaints on current medication. Denies side effects of statin including myalgia and arthralgia and nausea. Currently no chest pain, shortness of breath or other cardiovascular related symptoms noted.   History Gina Alexander has a past medical history of Depression, HIV infection ([place]), Hyperlipidemia, Hypertension, and Myocardial infarction ([place]).   She has a past surgical history that includes Cholecystectomy; Tubal ligation (2006); uterine ablation; and Breast enhancement surgery.   Her family history includes Arthritis in her mother; COPD in her mother.She reports that she has been smoking. She has been smoking about 0.50 packs per day. She quit smokeless tobacco use about 5 years ago. She reports current alcohol use of about 2.0 standard drinks of alcohol per week. She reports that she does not use drugs.  Current Outpatient Medications on File Prior to Visit  Medication Sig Dispense Refill  . Dolutegravir-lamiVUDine (DOVATO) 50-300 MG TABS Take 1 tablet by mouth daily.    Marland Kitchen ALPRAZolam (XANAX) 0.5 MG tablet Take 1 tablet (0.5 mg total) by mouth 2 (two) times daily as needed for anxiety. (Patient not taking: Reported on 02/18/2019) 12 tablet 0  . aspirin 81 MG chewable tablet Chew 81 mg by mouth.    . fluocinonide-emollient (LIDEX-E) 0.05 % cream APPLY SPARINGLY TO AFFECTED AREAS TWICE DAILY (Patient not taking: Reported on 02/18/2019) 30 g 5  . traMADol (ULTRAM) 50 MG tablet Take 1  tablet (50 mg total) by mouth 4 (four) times daily as needed for moderate pain. (Patient not taking: Reported on 02/18/2019) 30 tablet 5   No current facility-administered medications on file prior to visit.     ROS Review of Systems  Constitutional: Positive for fatigue.  HENT: Negative for congestion.   Eyes: Negative for visual disturbance.  Respiratory: Negative for shortness of breath.   Cardiovascular: Negative for chest pain.  Gastrointestinal: Negative for abdominal pain, constipation, diarrhea, nausea and vomiting.  Genitourinary: Negative for difficulty urinating.  Musculoskeletal: Negative for arthralgias and myalgias.  Neurological: Negative for headaches.  Psychiatric/Behavioral: Negative for sleep disturbance.    Objective:  BP 127/87   Pulse 65   Temp 98.6 F (37 C)   Resp 18   Ht 4' 11"  (1.499 m)   Wt 127 lb (57.6 kg)   SpO2 99%   BMI 25.65 kg/m   BP Readings from Last 3 Encounters:  02/18/19 127/87  08/19/18 139/80  02/18/18 119/81    Wt Readings from Last 3 Encounters:  02/18/19 127 lb (57.6 kg)  08/19/18 148 lb 2 oz (67.2 kg)  02/18/18 143 lb 6 oz (65 kg)     Physical Exam Constitutional:      General: She is not in acute distress.    Appearance: She is well-developed.  HENT:     Head: Normocephalic and atraumatic.     Right Ear: External ear normal.     Left Ear: External ear normal.     Nose: Nose normal.  Eyes:     Conjunctiva/sclera: Conjunctivae normal.     Pupils: Pupils  are equal, round, and reactive to light.  Neck:     Musculoskeletal: Normal range of motion and neck supple.     Thyroid: No thyromegaly.  Cardiovascular:     Rate and Rhythm: Normal rate and regular rhythm.     Heart sounds: Normal heart sounds. No murmur.  Pulmonary:     Effort: Pulmonary effort is normal. No respiratory distress.     Breath sounds: Normal breath sounds. No wheezing or rales.  Abdominal:     General: Bowel sounds are normal. There is no  distension.     Palpations: Abdomen is soft.     Tenderness: There is no abdominal tenderness.  Lymphadenopathy:     Cervical: No cervical adenopathy.  Skin:    General: Skin is warm and dry.  Neurological:     Mental Status: She is alert and oriented to person, place, and time.     Deep Tendon Reflexes: Reflexes are normal and symmetric.  Psychiatric:        Behavior: Behavior normal.        Thought Content: Thought content normal.        Judgment: Judgment normal.       Assessment & Plan:   Judieth was seen today for medical management of chronic issues and hyperlipidemia.  Diagnoses and all orders for this visit:  Essential hypertension -     Cancel: CBC with Differential/Platelet -     Cancel: CMP14+EGFR -     CBC with Differential/Platelet; Future -     CMP14+EGFR; Future  Fatigue, unspecified type -     Cancel: FSH/LH -     FSH/LH; Future  Other orders -     atorvastatin (LIPITOR) 40 MG tablet; TAKE 1 TABLETS (40 MG TOTAL) BY MOUTH DAILY. -     amLODipine (NORVASC) 5 MG tablet; Take 1 tablet (5 mg total) by mouth daily. For blood pressure   Allergies as of 02/18/2019      Reactions   Efavirenz Dermatitis, Shortness Of Breath   Sulfa Antibiotics Rash   Cymbalta [duloxetine Hcl] Nausea Only   Causes insomnia   Nelfinavir    Other reaction(s): Other (See Comments) Mouth ulcers   Duloxetine Nausea Only      Medication List       Accurate as of February 18, 2019 11:59 PM. If you have any questions, ask your nurse or doctor.        STOP taking these medications   diphenoxylate-atropine 2.5-0.025 MG tablet Commonly known as: Lomotil Stopped by: Claretta Fraise, MD   pregabalin 50 MG capsule Commonly known as: Lyrica Stopped by: Claretta Fraise, MD   Triumeq [id number] MG tablet Generic drug: abacavir-dolutegravir-lamiVUDine Stopped by: Claretta Fraise, MD     TAKE these medications   ALPRAZolam 0.5 MG tablet Commonly known as: XANAX Take 1 tablet  (0.5 mg total) by mouth 2 (two) times daily as needed for anxiety.   amLODipine 5 MG tablet Commonly known as: NORVASC Take 1 tablet (5 mg total) by mouth daily. For blood pressure   aspirin 81 MG chewable tablet Chew 81 mg by mouth.   atorvastatin 40 MG tablet Commonly known as: LIPITOR TAKE 1 TABLETS (40 MG TOTAL) BY MOUTH DAILY.   Dovato 50-300 MG Tabs Generic drug: Dolutegravir-lamiVUDine Take 1 tablet by mouth daily.   fluocinonide-emollient 0.05 % cream Commonly known as: LIDEX-E APPLY SPARINGLY TO AFFECTED AREAS TWICE DAILY   traMADol 50 MG tablet Commonly known as: ULTRAM Take 1 tablet (50 mg  total) by mouth 4 (four) times daily as needed for moderate pain.       Meds ordered this encounter  Medications  . atorvastatin (LIPITOR) 40 MG tablet    Sig: TAKE 1 TABLETS (40 MG TOTAL) BY MOUTH DAILY.    Dispense:  90 tablet    Refill:  1  . amLODipine (NORVASC) 5 MG tablet    Sig: Take 1 tablet (5 mg total) by mouth daily. For blood pressure    Dispense:  90 tablet    Refill:  1      Follow-up: No follow-ups on file.  Claretta Fraise, M.D.

## 2019-03-02 ENCOUNTER — Other Ambulatory Visit: Payer: Self-pay | Admitting: Family Medicine

## 2019-03-02 DIAGNOSIS — N3946 Mixed incontinence: Secondary | ICD-10-CM

## 2019-04-21 DIAGNOSIS — L910 Hypertrophic scar: Secondary | ICD-10-CM | POA: Diagnosis not present

## 2019-04-21 DIAGNOSIS — M6281 Muscle weakness (generalized): Secondary | ICD-10-CM | POA: Diagnosis not present

## 2019-04-28 DIAGNOSIS — L910 Hypertrophic scar: Secondary | ICD-10-CM | POA: Diagnosis not present

## 2019-04-28 DIAGNOSIS — M6281 Muscle weakness (generalized): Secondary | ICD-10-CM | POA: Diagnosis not present

## 2019-05-05 DIAGNOSIS — L910 Hypertrophic scar: Secondary | ICD-10-CM | POA: Diagnosis not present

## 2019-05-05 DIAGNOSIS — M6281 Muscle weakness (generalized): Secondary | ICD-10-CM | POA: Diagnosis not present

## 2019-05-18 ENCOUNTER — Other Ambulatory Visit: Payer: Self-pay | Admitting: Family Medicine

## 2019-07-14 DIAGNOSIS — Z79899 Other long term (current) drug therapy: Secondary | ICD-10-CM | POA: Diagnosis not present

## 2019-07-14 DIAGNOSIS — R030 Elevated blood-pressure reading, without diagnosis of hypertension: Secondary | ICD-10-CM | POA: Diagnosis not present

## 2019-07-14 DIAGNOSIS — G2581 Restless legs syndrome: Secondary | ICD-10-CM | POA: Diagnosis not present

## 2019-07-14 DIAGNOSIS — I2510 Atherosclerotic heart disease of native coronary artery without angina pectoris: Secondary | ICD-10-CM | POA: Diagnosis not present

## 2019-07-14 DIAGNOSIS — Z7982 Long term (current) use of aspirin: Secondary | ICD-10-CM | POA: Diagnosis not present

## 2019-07-14 DIAGNOSIS — F17210 Nicotine dependence, cigarettes, uncomplicated: Secondary | ICD-10-CM | POA: Diagnosis not present

## 2019-08-24 ENCOUNTER — Ambulatory Visit (INDEPENDENT_AMBULATORY_CARE_PROVIDER_SITE_OTHER): Payer: Managed Care, Other (non HMO) | Admitting: Family Medicine

## 2019-08-24 ENCOUNTER — Other Ambulatory Visit: Payer: Self-pay

## 2019-08-24 ENCOUNTER — Encounter: Payer: Self-pay | Admitting: Family Medicine

## 2019-08-24 VITALS — BP 137/85 | HR 64 | Temp 98.6°F | Ht 59.0 in | Wt 127.2 lb

## 2019-08-24 DIAGNOSIS — E782 Mixed hyperlipidemia: Secondary | ICD-10-CM | POA: Diagnosis not present

## 2019-08-24 DIAGNOSIS — I10 Essential (primary) hypertension: Secondary | ICD-10-CM | POA: Diagnosis not present

## 2019-08-24 DIAGNOSIS — F4321 Adjustment disorder with depressed mood: Secondary | ICD-10-CM | POA: Diagnosis not present

## 2019-08-24 MED ORDER — ATORVASTATIN CALCIUM 40 MG PO TABS: ORAL_TABLET | ORAL | 1 refills | Status: DC

## 2019-08-24 MED ORDER — AMLODIPINE BESYLATE 5 MG PO TABS: 5.0000 mg | ORAL_TABLET | Freq: Every day | ORAL | 1 refills | Status: DC

## 2019-08-24 NOTE — Progress Notes (Signed)
Subjective:  Patient ID: Gina Alexander, female    DOB: 09-18-1970  Age: 49 y.o. MRN: [id number]  CC: Follow-up   HPI Gina Alexander presents for  follow-up of hypertension. Patient has no history of headache chest pain or shortness of breath or recent cough. Patient also denies symptoms of TIA such as focal numbness or weakness. Patient denies side effects from medication. States taking it regularly.  in for follow-up of elevated cholesterol. Doing well without complaints on current medication. Denies side effects of statin including myalgia and arthralgia and nausea. Currently no chest pain, shortness of breath or other cardiovascular related symptoms noted.  Patient also still grieving over her first husband recently passed away last Dec 20, 2022.  She no longer is taking the Xanax.  But she still is eating grief counseling.  Her second husband and [name] have also divorced and she does not see been getting back together because he has some behaviors that she is not comfortable with.  She is living with her 2 teenage girls.  They have been having some trouble adjusting to virtual school program.   History Gina Alexander has a past medical history of Depression, HIV infection ([place]), Hyperlipidemia, Hypertension, and Myocardial infarction ([place]).   She has a past surgical history that includes Cholecystectomy; Tubal ligation (2006); uterine ablation; and Breast enhancement surgery.   Her family history includes Arthritis in her mother; COPD in her mother.She reports that she has been smoking. She has been smoking about 0.50 packs per day. She quit smokeless tobacco use about 5 years ago. She reports current alcohol use of about 2.0 standard drinks of alcohol per week. She reports that she does not use drugs.  Current Outpatient Medications on File Prior to Visit  Medication Sig Dispense Refill  . aspirin 81 MG chewable tablet Chew 81 mg by mouth.    . Dolutegravir-lamiVUDine (DOVATO) 50-300  MG TABS Take 1 tablet by mouth daily.    . fluocinonide-emollient (LIDEX-E) 0.05 % cream APPLY SPARINGLY TO AFFECTED AREAS TWICE DAILY 30 g 5  . traMADol (ULTRAM) 50 MG tablet Take 1 tablet (50 mg total) by mouth 4 (four) times daily as needed for moderate pain. (Patient not taking: Reported on 08/24/2019) 30 tablet 5   No current facility-administered medications on file prior to visit.    ROS Review of Systems  Constitutional: Negative.   HENT: Negative.   Eyes: Negative for visual disturbance.  Respiratory: Negative for shortness of breath.   Cardiovascular: Negative for chest pain.  Gastrointestinal: Negative for abdominal pain.  Musculoskeletal: Negative for arthralgias.    Objective:  BP 137/85   Pulse 64   Temp 98.6 F (37 C) (Temporal)   Ht 4' 11"  (1.499 m)   Wt 127 lb 3.2 oz (57.7 kg)   BMI 25.69 kg/m   BP Readings from Last 3 Encounters:  08/24/19 137/85  02/18/19 127/87  08/19/18 139/80    Wt Readings from Last 3 Encounters:  08/24/19 127 lb 3.2 oz (57.7 kg)  02/18/19 127 lb (57.6 kg)  08/19/18 148 lb 2 oz (67.2 kg)     Physical Exam Constitutional:      General: She is not in acute distress.    Appearance: She is well-developed.  Cardiovascular:     Rate and Rhythm: Normal rate and regular rhythm.  Pulmonary:     Breath sounds: Normal breath sounds.  Skin:    General: Skin is warm and dry.  Neurological:     Mental Status:  She is alert and oriented to person, place, and time.       Assessment & Plan:   Gina Alexander was seen today for follow-up.  Diagnoses and all orders for this visit:  Essential hypertension -     amLODipine (NORVASC) 5 MG tablet; Take 1 tablet (5 mg total) by mouth daily. For blood pressure -     CBC with Differential/Platelet -     CMP14+EGFR -     Lipid panel  Mixed hyperlipidemia -     atorvastatin (LIPITOR) 40 MG tablet; TAKE 1 TABLETS (40 MG TOTAL) BY MOUTH DAILY. -     CBC with Differential/Platelet -      CMP14+EGFR -     Lipid panel  Grief   Allergies as of 08/24/2019      Reactions   Efavirenz Dermatitis, Shortness Of Breath   Sulfa Antibiotics Rash   Cymbalta [duloxetine Hcl] Nausea Only   Causes insomnia   Nelfinavir    Other reaction(s): Other (See Comments) Mouth ulcers   Duloxetine Nausea Only      Medication List       Accurate as of August 24, 2019  5:17 PM. If you have any questions, ask your nurse or doctor.        STOP taking these medications   ALPRAZolam 0.5 MG tablet Commonly known as: XANAX Stopped by: Claretta Fraise, MD     TAKE these medications   amLODipine 5 MG tablet Commonly known as: NORVASC Take 1 tablet (5 mg total) by mouth daily. For blood pressure   aspirin 81 MG chewable tablet Chew 81 mg by mouth.   atorvastatin 40 MG tablet Commonly known as: LIPITOR TAKE 1 TABLETS (40 MG TOTAL) BY MOUTH DAILY.   Dovato 50-300 MG Tabs Generic drug: Dolutegravir-lamiVUDine Take 1 tablet by mouth daily.   fluocinonide-emollient 0.05 % cream Commonly known as: LIDEX-E APPLY SPARINGLY TO AFFECTED AREAS TWICE DAILY   traMADol 50 MG tablet Commonly known as: ULTRAM Take 1 tablet (50 mg total) by mouth 4 (four) times daily as needed for moderate pain.       Meds ordered this encounter  Medications  . atorvastatin (LIPITOR) 40 MG tablet    Sig: TAKE 1 TABLETS (40 MG TOTAL) BY MOUTH DAILY.    Dispense:  90 tablet    Refill:  1  . amLODipine (NORVASC) 5 MG tablet    Sig: Take 1 tablet (5 mg total) by mouth daily. For blood pressure    Dispense:  90 tablet    Refill:  1    Patient stable with regard to her hypertension and her hyperlipidemia.  Is usingHIV suppression treatment.  No problems with that.  She has been losing some weight.  Most of it in the early stages of grieving.  Please do blood work to determine if any changes are needed.  That is pending at this time.  Her HIV treatment is managed by infectious disease at [facility].  Medications were reviewed as above and renewed as appropriate.  She is no longer taking tramadol or Xanax.  Both of those have been discontinued.

## 2019-08-25 LAB — CMP14+EGFR
ALT: 15 IU/L (ref 0–32)
AST: 18 IU/L (ref 0–40)
Albumin/Globulin Ratio: 1.7 (ref 1.2–2.2)
Albumin: 4.4 g/dL (ref 3.8–4.8)
Alkaline Phosphatase: 104 IU/L (ref 39–117)
BUN/Creatinine Ratio: 13 (ref 9–23)
BUN: 12 mg/dL (ref 6–24)
Bilirubin Total: 0.4 mg/dL (ref 0.0–1.2)
CO2: 26 mmol/L (ref 20–29)
Calcium: 9.3 mg/dL (ref 8.7–10.2)
Chloride: 102 mmol/L (ref 96–106)
Creatinine, Ser: 0.9 mg/dL (ref 0.57–1.00)
GFR calc Af Amer: 88 mL/min/{1.73_m2} (ref >59)
GFR calc non Af Amer: 76 mL/min/{1.73_m2} (ref >59)
Globulin, Total: 2.6 g/dL (ref 1.5–4.5)
Glucose: 123 mg/dL — ABNORMAL HIGH (ref 65–99)
Potassium: 3.8 mmol/L (ref 3.5–5.2)
Sodium: 143 mmol/L (ref 134–144)
Total Protein: 7 g/dL (ref 6.0–8.5)

## 2019-08-25 LAB — CBC WITH DIFFERENTIAL/PLATELET
Basophils Absolute: 0.1 10*3/uL (ref 0.0–0.2)
Basos: 1 %
EOS (ABSOLUTE): 0.2 10*3/uL (ref 0.0–0.4)
Eos: 2 %
Hematocrit: 43 % (ref 34.0–46.6)
Hemoglobin: 15.1 g/dL (ref 11.1–15.9)
Immature Grans (Abs): 0 10*3/uL (ref 0.0–0.1)
Immature Granulocytes: 0 %
Lymphocytes Absolute: 4.1 10*3/uL — ABNORMAL HIGH (ref 0.7–3.1)
Lymphs: 39 %
MCH: 31.5 pg (ref 26.6–33.0)
MCHC: 35.1 g/dL (ref 31.5–35.7)
MCV: 90 fL (ref 79–97)
Monocytes Absolute: 0.6 10*3/uL (ref 0.1–0.9)
Monocytes: 5 %
Neutrophils Absolute: 5.6 10*3/uL (ref 1.4–7.0)
Neutrophils: 53 %
Platelets: 257 10*3/uL (ref 150–450)
RBC: 4.79 x10E6/uL (ref 3.77–5.28)
RDW: 13 % (ref 11.7–15.4)
WBC: 10.5 10*3/uL (ref 3.4–10.8)

## 2019-08-25 LAB — LIPID PANEL
Chol/HDL Ratio: 4.4 ratio (ref 0.0–4.4)
Cholesterol, Total: 127 mg/dL (ref 100–199)
HDL: 29 mg/dL — ABNORMAL LOW (ref >39)
LDL Chol Calc (NIH): 61 mg/dL (ref 0–99)
Triglycerides: 227 mg/dL — ABNORMAL HIGH (ref 0–149)
VLDL Cholesterol Cal: 37 mg/dL (ref 5–40)

## 2019-08-25 NOTE — Progress Notes (Signed)
Hello Zanaya,  Your lab result is normal and/or stable.Some minor variations that are not significant are commonly marked abnormal, but do not represent any medical problem for you.  Best regards, Anaira Seay, M.D.

## 2019-11-15 ENCOUNTER — Encounter: Payer: Self-pay | Admitting: *Deleted

## 2020-02-23 ENCOUNTER — Ambulatory Visit: Payer: Managed Care, Other (non HMO) | Admitting: Family Medicine

## 2020-02-24 ENCOUNTER — Ambulatory Visit: Payer: Managed Care, Other (non HMO) | Admitting: Family Medicine

## 2020-03-09 ENCOUNTER — Other Ambulatory Visit: Payer: Self-pay | Admitting: Family Medicine

## 2020-03-09 DIAGNOSIS — I10 Essential (primary) hypertension: Secondary | ICD-10-CM

## 2020-03-09 DIAGNOSIS — E782 Mixed hyperlipidemia: Secondary | ICD-10-CM

## 2020-03-13 ENCOUNTER — Other Ambulatory Visit: Payer: Self-pay

## 2020-03-13 ENCOUNTER — Encounter: Payer: Self-pay | Admitting: Family Medicine

## 2020-03-13 ENCOUNTER — Ambulatory Visit (INDEPENDENT_AMBULATORY_CARE_PROVIDER_SITE_OTHER): Payer: Managed Care, Other (non HMO) | Admitting: Family Medicine

## 2020-03-13 VITALS — BP 119/82 | HR 71 | Temp 97.2°F | Resp 20 | Ht 59.0 in | Wt 121.0 lb

## 2020-03-13 DIAGNOSIS — I10 Essential (primary) hypertension: Secondary | ICD-10-CM | POA: Diagnosis not present

## 2020-03-13 DIAGNOSIS — E782 Mixed hyperlipidemia: Secondary | ICD-10-CM

## 2020-03-13 MED ORDER — AMLODIPINE BESYLATE 5 MG PO TABS: ORAL_TABLET | ORAL | 3 refills | Status: DC

## 2020-03-13 MED ORDER — FLUOCINONIDE 0.05 % EX CREA: 1.0000 "application " | TOPICAL_CREAM | Freq: Two times a day (BID) | CUTANEOUS | 5 refills | Status: DC

## 2020-03-13 MED ORDER — ATORVASTATIN CALCIUM 40 MG PO TABS: 40.0000 mg | ORAL_TABLET | Freq: Every day | ORAL | 3 refills | Status: DC

## 2020-03-13 NOTE — Progress Notes (Signed)
Subjective:  Patient ID: Gina Alexander, female    DOB: 07-Mar-1971  Age: 49 y.o. MRN: [id number]  CC: Medical Management of Chronic Issues   HPI [name] presents for  follow-up of hypertension. Patient has no history of headache chest pain or shortness of breath or recent cough. Patient also denies symptoms of TIA such as focal numbness or weakness. Patient denies side effects from medication. States taking it regularly.   in for follow-up of elevated cholesterol. Doing well without complaints on current medication. Denies side effects of statin including myalgia and arthralgia and nausea. Currently no chest pain, shortness of breath or other cardiovascular related symptoms noted.  Patient also having significant conflict with her 49 year old daughter.  Typical mother daughter teenage girl conflict.  Counselor recommended.  Mom has history of HIV she has been asymptomatic for many years.  TakingDovato now.  She has a follow-up scheduled in 2 days with her HIV specialist.  History Gina Alexander has a past medical history of Depression, HIV infection ([place]), Hyperlipidemia, Hypertension, and Myocardial infarction ([place]).   She has a past surgical history that includes Cholecystectomy; Tubal ligation (2006); uterine ablation; and Breast enhancement surgery.   Her family history includes Arthritis in her mother; COPD in her mother.She reports that she has been smoking. She has been smoking about 0.50 packs per day. She quit smokeless tobacco use about 6 years ago. She reports current alcohol use of about 2.0 standard drinks of alcohol per week. She reports that she does not use drugs.  Current Outpatient Medications on File Prior to Visit  Medication Sig Dispense Refill  . Dolutegravir-lamiVUDine (DOVATO) 50-300 MG TABS Take 1 tablet by mouth daily.    Marland Kitchen aspirin 81 MG chewable tablet Chew 81 mg by mouth. (Patient not taking: Reported on 03/13/2020)    . fluocinonide-emollient (LIDEX-E) 0.05  % cream APPLY SPARINGLY TO AFFECTED AREAS TWICE DAILY 30 g 5   No current facility-administered medications on file prior to visit.    ROS Review of Systems  Constitutional: Negative for appetite change, chills, diaphoresis, fatigue and fever.  HENT: Negative for congestion, ear pain, hearing loss, postnasal drip, rhinorrhea, sore throat and trouble swallowing.   Respiratory: Negative for cough, chest tightness and shortness of breath.   Cardiovascular: Negative for chest pain and palpitations.  Gastrointestinal: Negative for abdominal pain.  Musculoskeletal: Negative for arthralgias.  Skin: Negative for rash.    Objective:  BP 119/82   Pulse 71   Temp (!) 97.2 F (36.2 C) (Temporal)   Resp 20   Ht 4' 11"  (1.499 m)   Wt 121 lb (54.9 kg)   SpO2 98%   BMI 24.44 kg/m   BP Readings from Last 3 Encounters:  03/13/20 119/82  08/24/19 137/85  02/18/19 127/87    Wt Readings from Last 3 Encounters:  03/13/20 121 lb (54.9 kg)  08/24/19 127 lb 3.2 oz (57.7 kg)  02/18/19 127 lb (57.6 kg)     Physical Exam Constitutional:      General: She is not in acute distress.    Appearance: She is well-developed.  Cardiovascular:     Rate and Rhythm: Normal rate and regular rhythm.  Pulmonary:     Breath sounds: Normal breath sounds.  Skin:    General: Skin is warm and dry.  Neurological:     Mental Status: She is alert and oriented to person, place, and time.       Assessment & Plan:   Keamber was seen today for  medical management of chronic issues.  Diagnoses and all orders for this visit:  Mixed hyperlipidemia -     CBC with Differential/Platelet -     CMP14+EGFR -     Lipid panel -     atorvastatin (LIPITOR) 40 MG tablet; Take 1 tablet (40 mg total) by mouth daily.  Essential hypertension -     CBC with Differential/Platelet -     CMP14+EGFR -     Lipid panel -     amLODipine (NORVASC) 5 MG tablet; TAKE 1 TABLET BY MOUTH EVERY DAY FOR BLOOD PRESSURE  Other  orders -     fluocinonide cream (LIDEX) 0.05 %; Apply 1 application topically 2 (two) times daily.   Allergies as of 03/13/2020      Reactions   Efavirenz Dermatitis, Shortness Of Breath   Sulfa Antibiotics Rash   Cymbalta [duloxetine Hcl] Nausea Only   Causes insomnia   Nelfinavir    Other reaction(s): Other (See Comments) Mouth ulcers   Duloxetine Nausea Only      Medication List       Accurate as of March 13, 2020  5:12 PM. If you have any questions, ask your nurse or doctor.        STOP taking these medications   traMADol 50 MG tablet Commonly known as: ULTRAM Stopped by: Claretta Fraise, MD     TAKE these medications   amLODipine 5 MG tablet Commonly known as: NORVASC TAKE 1 TABLET BY MOUTH EVERY DAY FOR BLOOD PRESSURE   aspirin 81 MG chewable tablet Chew 81 mg by mouth.   atorvastatin 40 MG tablet Commonly known as: LIPITOR Take 1 tablet (40 mg total) by mouth daily. What changed: See the new instructions. Changed by: Claretta Fraise, MD   Dovato 50-300 MG Tabs Generic drug: Dolutegravir-lamiVUDine Take 1 tablet by mouth daily.   fluocinonide cream 0.05 % Commonly known as: LIDEX Apply 1 application topically 2 (two) times daily. Started by: Claretta Fraise, MD   fluocinonide-emollient 0.05 % cream Commonly known as: LIDEX-E APPLY SPARINGLY TO AFFECTED AREAS TWICE DAILY       Meds ordered this encounter  Medications  . amLODipine (NORVASC) 5 MG tablet    Sig: TAKE 1 TABLET BY MOUTH EVERY DAY FOR BLOOD PRESSURE    Dispense:  90 tablet    Refill:  3  . atorvastatin (LIPITOR) 40 MG tablet    Sig: Take 1 tablet (40 mg total) by mouth daily.    Dispense:  90 tablet    Refill:  3  . fluocinonide cream (LIDEX) 0.05 %    Sig: Apply 1 application topically 2 (two) times daily.    Dispense:  60 g    Refill:  5      Follow-up: Return in about 6 months (around 09/11/2020).  Claretta Fraise, M.D.

## 2020-03-14 LAB — CMP14+EGFR
ALT: 16 IU/L (ref 0–32)
AST: 15 IU/L (ref 0–40)
Albumin/Globulin Ratio: 1.7 (ref 1.2–2.2)
Albumin: 4.6 g/dL (ref 3.8–4.8)
Alkaline Phosphatase: 117 IU/L (ref 44–121)
BUN/Creatinine Ratio: 10 (ref 9–23)
BUN: 9 mg/dL (ref 6–24)
Bilirubin Total: 0.2 mg/dL (ref 0.0–1.2)
CO2: 21 mmol/L (ref 20–29)
Calcium: 9.2 mg/dL (ref 8.7–10.2)
Chloride: 106 mmol/L (ref 96–106)
Creatinine, Ser: 0.87 mg/dL (ref 0.57–1.00)
GFR calc Af Amer: 92 mL/min/{1.73_m2} (ref >59)
GFR calc non Af Amer: 80 mL/min/{1.73_m2} (ref >59)
Globulin, Total: 2.7 g/dL (ref 1.5–4.5)
Glucose: 106 mg/dL — ABNORMAL HIGH (ref 65–99)
Potassium: 4.2 mmol/L (ref 3.5–5.2)
Sodium: 143 mmol/L (ref 134–144)
Total Protein: 7.3 g/dL (ref 6.0–8.5)

## 2020-03-14 LAB — CBC WITH DIFFERENTIAL/PLATELET
Basophils Absolute: 0.1 10*3/uL (ref 0.0–0.2)
Basos: 1 %
EOS (ABSOLUTE): 0.3 10*3/uL (ref 0.0–0.4)
Eos: 2 %
Hematocrit: 44.4 % (ref 34.0–46.6)
Hemoglobin: 14.9 g/dL (ref 11.1–15.9)
Immature Grans (Abs): 0 10*3/uL (ref 0.0–0.1)
Immature Granulocytes: 0 %
Lymphocytes Absolute: 5.2 10*3/uL — ABNORMAL HIGH (ref 0.7–3.1)
Lymphs: 37 %
MCH: 31.9 pg (ref 26.6–33.0)
MCHC: 33.6 g/dL (ref 31.5–35.7)
MCV: 95 fL (ref 79–97)
Monocytes Absolute: 0.9 10*3/uL (ref 0.1–0.9)
Monocytes: 7 %
Neutrophils Absolute: 7.5 10*3/uL — ABNORMAL HIGH (ref 1.4–7.0)
Neutrophils: 53 %
Platelets: 261 10*3/uL (ref 150–450)
RBC: 4.67 x10E6/uL (ref 3.77–5.28)
RDW: 13 % (ref 11.7–15.4)
WBC: 14 10*3/uL — ABNORMAL HIGH (ref 3.4–10.8)

## 2020-03-14 LAB — LIPID PANEL
Chol/HDL Ratio: 3.7 ratio (ref 0.0–4.4)
Cholesterol, Total: 119 mg/dL (ref 100–199)
HDL: 32 mg/dL — ABNORMAL LOW (ref >39)
LDL Chol Calc (NIH): 53 mg/dL (ref 0–99)
Triglycerides: 208 mg/dL — ABNORMAL HIGH (ref 0–149)
VLDL Cholesterol Cal: 34 mg/dL (ref 5–40)

## 2020-04-20 DIAGNOSIS — R85610 Atypical squamous cells of undetermined significance on cytologic smear of anus (ASC-US): Secondary | ICD-10-CM | POA: Diagnosis not present

## 2020-06-29 DIAGNOSIS — Z23 Encounter for immunization: Secondary | ICD-10-CM | POA: Diagnosis not present

## 2020-09-13 DIAGNOSIS — I2510 Atherosclerotic heart disease of native coronary artery without angina pectoris: Secondary | ICD-10-CM | POA: Diagnosis not present

## 2020-09-13 DIAGNOSIS — F17210 Nicotine dependence, cigarettes, uncomplicated: Secondary | ICD-10-CM | POA: Diagnosis not present

## 2020-09-13 DIAGNOSIS — G2581 Restless legs syndrome: Secondary | ICD-10-CM | POA: Diagnosis not present

## 2020-09-13 DIAGNOSIS — R030 Elevated blood-pressure reading, without diagnosis of hypertension: Secondary | ICD-10-CM | POA: Diagnosis not present

## 2020-12-30 DIAGNOSIS — S62326A Displaced fracture of shaft of fifth metacarpal bone, right hand, initial encounter for closed fracture: Secondary | ICD-10-CM | POA: Diagnosis not present

## 2020-12-30 DIAGNOSIS — Z888 Allergy status to other drugs, medicaments and biological substances status: Secondary | ICD-10-CM | POA: Diagnosis not present

## 2020-12-30 DIAGNOSIS — Z79899 Other long term (current) drug therapy: Secondary | ICD-10-CM | POA: Diagnosis not present

## 2020-12-30 DIAGNOSIS — S62306A Unspecified fracture of fifth metacarpal bone, right hand, initial encounter for closed fracture: Secondary | ICD-10-CM | POA: Diagnosis not present

## 2020-12-30 DIAGNOSIS — M79641 Pain in right hand: Secondary | ICD-10-CM | POA: Diagnosis not present

## 2020-12-30 DIAGNOSIS — Z882 Allergy status to sulfonamides status: Secondary | ICD-10-CM | POA: Diagnosis not present

## 2020-12-30 DIAGNOSIS — Z7982 Long term (current) use of aspirin: Secondary | ICD-10-CM | POA: Diagnosis not present

## 2020-12-30 DIAGNOSIS — E785 Hyperlipidemia, unspecified: Secondary | ICD-10-CM | POA: Diagnosis not present

## 2020-12-30 DIAGNOSIS — Z87891 Personal history of nicotine dependence: Secondary | ICD-10-CM | POA: Diagnosis not present

## 2020-12-30 DIAGNOSIS — Z955 Presence of coronary angioplasty implant and graft: Secondary | ICD-10-CM | POA: Diagnosis not present

## 2021-01-03 DIAGNOSIS — S62336A Displaced fracture of neck of fifth metacarpal bone, right hand, initial encounter for closed fracture: Secondary | ICD-10-CM | POA: Diagnosis not present

## 2021-01-05 DIAGNOSIS — G8918 Other acute postprocedural pain: Secondary | ICD-10-CM | POA: Diagnosis not present

## 2021-01-05 DIAGNOSIS — S62336A Displaced fracture of neck of fifth metacarpal bone, right hand, initial encounter for closed fracture: Secondary | ICD-10-CM | POA: Diagnosis not present

## 2021-01-10 DIAGNOSIS — S62336A Displaced fracture of neck of fifth metacarpal bone, right hand, initial encounter for closed fracture: Secondary | ICD-10-CM | POA: Diagnosis not present

## 2021-01-16 DIAGNOSIS — M25641 Stiffness of right hand, not elsewhere classified: Secondary | ICD-10-CM | POA: Diagnosis not present

## 2021-01-16 DIAGNOSIS — M79641 Pain in right hand: Secondary | ICD-10-CM | POA: Diagnosis not present

## 2021-01-17 DIAGNOSIS — M79641 Pain in right hand: Secondary | ICD-10-CM | POA: Diagnosis not present

## 2021-01-23 DIAGNOSIS — M79641 Pain in right hand: Secondary | ICD-10-CM | POA: Diagnosis not present

## 2021-01-23 DIAGNOSIS — M25641 Stiffness of right hand, not elsewhere classified: Secondary | ICD-10-CM | POA: Diagnosis not present

## 2021-01-31 DIAGNOSIS — M79641 Pain in right hand: Secondary | ICD-10-CM | POA: Diagnosis not present

## 2021-01-31 DIAGNOSIS — S62336D Displaced fracture of neck of fifth metacarpal bone, right hand, subsequent encounter for fracture with routine healing: Secondary | ICD-10-CM | POA: Diagnosis not present

## 2021-01-31 DIAGNOSIS — M25641 Stiffness of right hand, not elsewhere classified: Secondary | ICD-10-CM | POA: Diagnosis not present

## 2021-02-16 DIAGNOSIS — S62336D Displaced fracture of neck of fifth metacarpal bone, right hand, subsequent encounter for fracture with routine healing: Secondary | ICD-10-CM | POA: Diagnosis not present

## 2021-02-16 DIAGNOSIS — M25641 Stiffness of right hand, not elsewhere classified: Secondary | ICD-10-CM | POA: Diagnosis not present

## 2021-02-16 DIAGNOSIS — M79641 Pain in right hand: Secondary | ICD-10-CM | POA: Diagnosis not present

## 2021-02-21 DIAGNOSIS — M79641 Pain in right hand: Secondary | ICD-10-CM | POA: Diagnosis not present

## 2021-02-21 DIAGNOSIS — S62336D Displaced fracture of neck of fifth metacarpal bone, right hand, subsequent encounter for fracture with routine healing: Secondary | ICD-10-CM | POA: Diagnosis not present

## 2021-02-21 DIAGNOSIS — M25641 Stiffness of right hand, not elsewhere classified: Secondary | ICD-10-CM | POA: Diagnosis not present

## 2021-02-27 ENCOUNTER — Encounter: Payer: Self-pay | Admitting: Family Medicine

## 2021-02-28 ENCOUNTER — Other Ambulatory Visit: Payer: Self-pay | Admitting: Family Medicine

## 2021-02-28 MED ORDER — MOLNUPIRAVIR EUA 200MG CAPSULE: 4.0000 | ORAL_CAPSULE | Freq: Two times a day (BID) | ORAL | 0 refills | Status: AC

## 2021-03-28 DIAGNOSIS — F17210 Nicotine dependence, cigarettes, uncomplicated: Secondary | ICD-10-CM | POA: Diagnosis not present

## 2021-03-28 DIAGNOSIS — R030 Elevated blood-pressure reading, without diagnosis of hypertension: Secondary | ICD-10-CM | POA: Diagnosis not present

## 2021-03-28 DIAGNOSIS — G2581 Restless legs syndrome: Secondary | ICD-10-CM | POA: Diagnosis not present

## 2021-03-28 DIAGNOSIS — E611 Iron deficiency: Secondary | ICD-10-CM | POA: Diagnosis not present

## 2021-03-28 DIAGNOSIS — Z9889 Other specified postprocedural states: Secondary | ICD-10-CM | POA: Diagnosis not present

## 2021-03-28 DIAGNOSIS — Z23 Encounter for immunization: Secondary | ICD-10-CM | POA: Diagnosis not present

## 2021-03-28 DIAGNOSIS — Z79899 Other long term (current) drug therapy: Secondary | ICD-10-CM | POA: Diagnosis not present

## 2021-03-28 DIAGNOSIS — I2510 Atherosclerotic heart disease of native coronary artery without angina pectoris: Secondary | ICD-10-CM | POA: Diagnosis not present

## 2021-04-10 DIAGNOSIS — M79641 Pain in right hand: Secondary | ICD-10-CM | POA: Diagnosis not present

## 2021-04-27 DIAGNOSIS — K6282 Dysplasia of anus: Secondary | ICD-10-CM | POA: Diagnosis not present

## 2021-04-27 DIAGNOSIS — R85612 Low grade squamous intraepithelial lesion on cytologic smear of anus (LGSIL): Secondary | ICD-10-CM | POA: Diagnosis not present

## 2021-04-28 ENCOUNTER — Encounter: Payer: Self-pay | Admitting: Family Medicine

## 2021-04-30 ENCOUNTER — Ambulatory Visit (INDEPENDENT_AMBULATORY_CARE_PROVIDER_SITE_OTHER): Payer: Medicare Other | Admitting: Nurse Practitioner

## 2021-04-30 ENCOUNTER — Encounter: Payer: Self-pay | Admitting: Nurse Practitioner

## 2021-04-30 DIAGNOSIS — R6889 Other general symptoms and signs: Secondary | ICD-10-CM

## 2021-04-30 DIAGNOSIS — Z20828 Contact with and (suspected) exposure to other viral communicable diseases: Secondary | ICD-10-CM

## 2021-04-30 MED ORDER — OSELTAMIVIR PHOSPHATE 75 MG PO CAPS: 75.0000 mg | ORAL_CAPSULE | Freq: Two times a day (BID) | ORAL | 0 refills | Status: DC

## 2021-04-30 NOTE — Progress Notes (Signed)
Virtual Visit  Note Due to COVID-19 pandemic this visit was conducted virtually. This visit type was conducted due to national recommendations for restrictions regarding the COVID-19 Pandemic (e.g. social distancing, sheltering in place) in an effort to limit this patient's exposure and mitigate transmission in our community. All issues noted in this document were discussed and addressed.  A physical exam was not performed with this format.  I connected with Gina Alexander on 04/30/21 at 1:25 by telephone and verified that I am speaking with the correct person using two identifiers. Gina Alexander is currently located at home and her family  is currently with her during visit. The provider, Mary-Margaret Daphine Deutscher, FNP is located in their office at time of visit.  I discussed the limitations, risks, security and privacy concerns of performing an evaluation and management service by telephone and the availability of in person appointments. I also discussed with the patient that there may be a patient responsible charge related to this service. The patient expressed understanding and agreed to proceed.   History and Present Illness:  Patient states that sh ehas body aches and severe fatigue. Has progressed to chills, shakiness and sore thorat. Slight diarrhea today. Hs been on coricidin. Both kids tested positive for flu on Friday.     Review of Systems  Constitutional:  Positive for chills and malaise/fatigue. Negative for fever.  HENT:  Positive for congestion and sore throat.   Respiratory:  Positive for cough and sputum production. Negative for shortness of breath and wheezing.   Musculoskeletal:  Positive for myalgias.  Neurological:  Positive for dizziness and headaches.    Observations/Objective: Alert and oriented- answers all questions appropriately No distress   Assessment and Plan: Gina Alexander in today with chief complaint of flu exposure  1. Flu-like symptoms  2.  Exposure to the flu 1. Take meds as prescribed 2. Use a cool mist humidifier especially during the winter months and when heat has been humid. 3. Use saline nose sprays frequently 4. Saline irrigations of the nose can be very helpful if done frequently.  * 4X daily for 1 week*  * Use of a nettie pot can be helpful with this. Follow directions with this* 5. Drink plenty of fluids 6. Keep thermostat turn down low 7.For any cough or congestion  Use plain Mucinex- regular strength or max strength is fine   * Children- consult with Pharmacist for dosing 8. For fever or aces or pains- take tylenol or ibuprofen appropriate for age and weight.  * for fevers greater than 101 orally you may alternate ibuprofen and tylenol every  3 hours.   Meds ordered this encounter  Medications   oseltamivir (TAMIFLU) 75 MG capsule    Sig: Take 1 capsule (75 mg total) by mouth 2 (two) times daily.    Dispense:  10 capsule    Refill:  0    Order Specific Question:   Supervising Provider    Answer:   Arville Care A [[id number]]       Follow Up Instructions: prn    I discussed the assessment and treatment plan with the patient. The patient was provided an opportunity to ask questions and all were answered. The patient agreed with the plan and demonstrated an understanding of the instructions.   The patient was advised to call back or seek an in-person evaluation if the symptoms worsen or if the condition fails to improve as anticipated.  The above assessment and management plan was discussed with the  patient. The patient verbalized understanding of and has agreed to the management plan. Patient is aware to call the clinic if symptoms persist or worsen. Patient is aware when to return to the clinic for a follow-up visit. Patient educated on when it is appropriate to go to the emergency department.   Time call ended:  1:38  I provided 12 minutes of  non face-to-face time during this  encounter.    Mary-Margaret Daphine Deutscher, FNP

## 2021-04-30 NOTE — Patient Instructions (Signed)
Influenza, Adult °Influenza is also called "the flu." It is an infection in the lungs, nose, and throat (respiratory tract). It spreads easily from person to person (is contagious). The flu causes symptoms that are like a cold, along with high fever and body aches. °What are the causes? °This condition is caused by the influenza virus. You can get the virus by: °Breathing in droplets that are in the air after a person infected with the flu coughed or sneezed. °Touching something that has the virus on it and then touching your mouth, nose, or eyes. °What increases the risk? °Certain things may make you more likely to get the flu. These include: °Not washing your hands often. °Having close contact with many people during cold and flu season. °Touching your mouth, eyes, or nose without first washing your hands. °Not getting a flu shot every year. °You may have a higher risk for the flu, and serious problems, such as a lung infection (pneumonia), if you: °Are older than 65. °Are pregnant. °Have a weakened disease-fighting system (immune system) because of a disease or because you are taking certain medicines. °Have a long-term (chronic) condition, such as: °Heart, kidney, or lung disease. °Diabetes. °Asthma. °Have a liver disorder. °Are very overweight (morbidly obese). °Have anemia. °What are the signs or symptoms? °Symptoms usually begin suddenly and last 4-14 days. They may include: °Fever and chills. °Headaches, body aches, or muscle aches. °Sore throat. °Cough. °Runny or stuffy (congested) nose. °Feeling discomfort in your chest. °Not wanting to eat as much as normal. °Feeling weak or tired. °Feeling dizzy. °Feeling sick to your stomach or throwing up. °How is this treated? °If the flu is found early, you can be treated with antiviral medicine. This can help to reduce how bad the illness is and how long it lasts. This may be given by mouth or through an IV tube. °Taking care of yourself at home can help your  symptoms get better. Your doctor may want you to: °Take over-the-counter medicines. °Drink plenty of fluids. °The flu often goes away on its own. If you have very bad symptoms or other problems, you may be treated in a hospital. °Follow these instructions at home: °  °Activity °Rest as needed. Get plenty of sleep. °Stay home from work or school as told by your doctor. °Do not leave home until you do not have a fever for 24 hours without taking medicine. °Leave home only to go to your doctor. °Eating and drinking °Take an ORS (oral rehydration solution). This is a drink that is sold at pharmacies and stores. °Drink enough fluid to keep your pee pale yellow. °Drink clear fluids in small amounts as you are able. Clear fluids include: °Water. °Ice chips. °Fruit juice mixed with water. °Low-calorie sports drinks. °Eat bland foods that are easy to digest. Eat small amounts as you are able. These foods include: °Bananas. °Applesauce. °Rice. °Lean meats. °Toast. °Crackers. °Do not eat or drink: °Fluids that have a lot of sugar or caffeine. °Alcohol. °Spicy or fatty foods. °General instructions °Take over-the-counter and prescription medicines only as told by your doctor. °Use a cool mist humidifier to add moisture to the air in your home. This can make it easier for you to breathe. °When using a cool mist humidifier, clean it daily. Empty water and replace with clean water. °Cover your mouth and nose when you cough or sneeze. °Wash your hands with soap and water often and for at least 20 seconds. This is also important after   you cough or sneeze. If you cannot use soap and water, use alcohol-based hand sanitizer. °Keep all follow-up visits. °How is this prevented? ° °Get a flu shot every year. You may get the flu shot in late summer, fall, or winter. Ask your doctor when you should get your flu shot. °Avoid contact with people who are sick during fall and winter. This is cold and flu season. °Contact a doctor if: °You get  new symptoms. °You have: °Chest pain. °Watery poop (diarrhea). °A fever. °Your cough gets worse. °You start to have more mucus. °You feel sick to your stomach. °You throw up. °Get help right away if you: °Have shortness of breath. °Have trouble breathing. °Have skin or nails that turn a bluish color. °Have very bad pain or stiffness in your neck. °Get a sudden headache. °Get sudden pain in your face or ear. °Cannot eat or drink without throwing up. °These symptoms may represent a serious problem that is an emergency. Get medical help right away. Call your local emergency services (911 in the [place] °Do not wait to see if the symptoms will go away. °Do not drive yourself to the hospital. °Summary °Influenza is also called "the flu." It is an infection in the lungs, nose, and throat. It spreads easily from person to person. °Take over-the-counter and prescription medicines only as told by your doctor. °Getting a flu shot every year is the best way to not get the flu. °This information is not intended to replace advice given to you by your health care provider. Make sure you discuss any questions you have with your health care provider. °Document Revised: 01/14/2020 Document Reviewed: 01/14/2020 °Elsevier Patient Education © 2022 [employer] ° °

## 2021-05-21 ENCOUNTER — Ambulatory Visit: Payer: Medicare Other

## 2021-07-12 ENCOUNTER — Telehealth: Payer: Self-pay | Admitting: Family Medicine

## 2021-07-12 NOTE — Telephone Encounter (Signed)
°  Left message for patient to call back and schedule Medicare Annual Wellness Visit (AWV) to be completed by video or phone. ° °No hx of AWV eligible for AWVI as of 06/10/2009 per [employer]  ° °Please schedule at anytime with [facility] Nurse Health Advisor --- Mary Jane ° °45 Minutes appointment  ° °Any questions, please call me at [phone]   °

## 2021-08-13 ENCOUNTER — Telehealth: Payer: Self-pay | Admitting: Family Medicine

## 2021-08-13 NOTE — Telephone Encounter (Signed)
?  Left message for patient to call back and schedule Medicare Annual Wellness Visit (AWV) to be completed by video or phone. ? ?No hx of AWV eligible for AWVI per [employer] as of  06/10/2009 ? ?Please schedule at anytime with [facility] Nurse Health Advisor --- Mary Jane ? ?45 Minutes appointment  ? ?Any questions, please call me at [phone]   ?

## 2021-09-11 ENCOUNTER — Telehealth: Payer: Self-pay | Admitting: Family Medicine

## 2021-09-11 NOTE — Telephone Encounter (Signed)
?  Left message for patient to call back and schedule Medicare Annual Wellness Visit (AWV) to be completed by video or phone. ? ?No hx of AWV eligible for AWVI per [employer] as of  06/10/2009 ? ?Please schedule at anytime with [facility] Nurse Health Advisor --- Mary Jane ? ?45 Minutes appointment  ? ?Any questions, please call me at [phone]   ?

## 2021-09-28 DIAGNOSIS — Z1212 Encounter for screening for malignant neoplasm of rectum: Secondary | ICD-10-CM | POA: Diagnosis not present

## 2021-10-03 DIAGNOSIS — Z006 Encounter for examination for normal comparison and control in clinical research program: Secondary | ICD-10-CM | POA: Diagnosis not present

## 2021-10-03 DIAGNOSIS — I2510 Atherosclerotic heart disease of native coronary artery without angina pectoris: Secondary | ICD-10-CM | POA: Diagnosis not present

## 2021-10-03 DIAGNOSIS — I10 Essential (primary) hypertension: Secondary | ICD-10-CM | POA: Diagnosis not present

## 2021-10-03 DIAGNOSIS — Z79899 Other long term (current) drug therapy: Secondary | ICD-10-CM | POA: Diagnosis not present

## 2021-10-03 DIAGNOSIS — F17210 Nicotine dependence, cigarettes, uncomplicated: Secondary | ICD-10-CM | POA: Diagnosis not present

## 2021-10-03 DIAGNOSIS — G2581 Restless legs syndrome: Secondary | ICD-10-CM | POA: Diagnosis not present

## 2021-10-05 ENCOUNTER — Telehealth: Payer: Self-pay | Admitting: Family Medicine

## 2021-10-05 NOTE — Telephone Encounter (Signed)
?  Left message for patient to call back and schedule Medicare Annual Wellness Visit (AWV) to be completed by video or phone. ? ?No hx of AWV eligible for AWVI per [employer] as of  06/10/2009 ? ?Please schedule at anytime with [facility] Nurse Health Advisor --- Mary Jane ? ?45 Minutes appointment  ? ?Any questions, please call me at [phone]   ?

## 2021-10-27 ENCOUNTER — Encounter: Payer: Self-pay | Admitting: Family Medicine

## 2021-10-29 NOTE — Telephone Encounter (Signed)
Have her stay off of it today. I can see her tomorrow at 2:25.

## 2021-10-30 ENCOUNTER — Ambulatory Visit (INDEPENDENT_AMBULATORY_CARE_PROVIDER_SITE_OTHER): Payer: Medicare Other | Admitting: Family Medicine

## 2021-10-30 ENCOUNTER — Encounter: Payer: Self-pay | Admitting: Family Medicine

## 2021-10-30 ENCOUNTER — Ambulatory Visit (INDEPENDENT_AMBULATORY_CARE_PROVIDER_SITE_OTHER): Payer: Medicare Other

## 2021-10-30 VITALS — BP 131/85 | HR 53 | Temp 97.8°F | Ht 59.0 in | Wt 124.0 lb

## 2021-10-30 DIAGNOSIS — S9032XA Contusion of left foot, initial encounter: Secondary | ICD-10-CM

## 2021-10-30 DIAGNOSIS — K2100 Gastro-esophageal reflux disease with esophagitis, without bleeding: Secondary | ICD-10-CM | POA: Diagnosis not present

## 2021-10-30 DIAGNOSIS — R072 Precordial pain: Secondary | ICD-10-CM | POA: Diagnosis not present

## 2021-10-30 DIAGNOSIS — S92535A Nondisplaced fracture of distal phalanx of left lesser toe(s), initial encounter for closed fracture: Secondary | ICD-10-CM | POA: Diagnosis not present

## 2021-10-30 MED ORDER — PANTOPRAZOLE SODIUM 40 MG PO TBEC: 40.0000 mg | DELAYED_RELEASE_TABLET | Freq: Every day | ORAL | 11 refills | Status: DC

## 2021-10-30 MED ORDER — IBUPROFEN 800 MG PO TABS: 800.0000 mg | ORAL_TABLET | Freq: Three times a day (TID) | ORAL | 0 refills | Status: DC | PRN

## 2021-10-30 NOTE — Progress Notes (Addendum)
Subjective:  Patient ID: Gina Alexander, female    DOB: 03/20/1971  Age: 51 y.o. MRN: [id number]  CC: Toe Injury   HPI [name] presents for Throbbing since injury. Occurred 11 AM on 5/20. Stubbed toe on an ottoman in the bedroom. Hobbles with walking. Wearing a borrowed post op shoe which helps some with pain and mobility.   Pt. Reports having episodes of exertional chest pain recently.      10/30/2021    2:38 PM 03/13/2020    4:29 PM 08/24/2019    4:36 PM  Depression screen PHQ 2/9  Decreased Interest 0 0 0  Down, Depressed, Hopeless 0 0 0  PHQ - 2 Score 0 0 0  Altered sleeping 0    Tired, decreased energy 2    Change in appetite 3    Feeling bad or failure about yourself  1    Trouble concentrating 0    Moving slowly or fidgety/restless 0    Suicidal thoughts 0    PHQ-9 Score 6    Difficult doing work/chores Somewhat difficult      History Gina Alexander has a past medical history of Depression, HIV infection ([place]), Hyperlipidemia, Hypertension, and Myocardial infarction ([place]).   She has a past surgical history that includes Cholecystectomy; Tubal ligation (2006); uterine ablation; and Breast enhancement surgery.   Her family history includes Arthritis in her mother; COPD in her mother.She reports that she has been smoking cigarettes. She has been smoking an average of .5 packs per day. She quit smokeless tobacco use about 7 years ago. She reports current alcohol use of about 2.0 standard drinks per week. She reports that she does not use drugs.    ROS Review of Systems  Constitutional: Negative.   HENT: Negative.    Eyes:  Negative for visual disturbance.  Respiratory:  Negative for shortness of breath.   Cardiovascular:  Positive for chest pain.  Gastrointestinal:  Negative for abdominal pain.  Musculoskeletal:  Negative for arthralgias.   Objective:  BP 131/85   Pulse (!) 53   Temp 97.8 F (36.6 C)   Ht 4\' 11"  (1.499 m)   Wt 124 lb (56.2 kg)   SpO2 100%    BMI 25.04 kg/m   BP Readings from Last 3 Encounters:  10/30/21 131/85  03/13/20 119/82  08/24/19 137/85    Wt Readings from Last 3 Encounters:  10/30/21 124 lb (56.2 kg)  03/13/20 121 lb (54.9 kg)  08/24/19 127 lb 3.2 oz (57.7 kg)     Physical Exam Constitutional:      General: She is not in acute distress.    Appearance: She is well-developed.  HENT:     Head: Normocephalic and atraumatic.  Eyes:     Conjunctiva/sclera: Conjunctivae normal.     Pupils: Pupils are equal, round, and reactive to light.  Neck:     Thyroid: No thyromegaly.  Cardiovascular:     Rate and Rhythm: Normal rate and regular rhythm.     Heart sounds: Normal heart sounds. No murmur heard. Pulmonary:     Effort: Pulmonary effort is normal. No respiratory distress.     Breath sounds: Normal breath sounds. No wheezing or rales.  Abdominal:     General: Bowel sounds are normal. There is no distension.     Palpations: Abdomen is soft.     Tenderness: There is no abdominal tenderness.  Musculoskeletal:        General: Normal range of motion.     Cervical  back: Normal range of motion and neck supple.  Lymphadenopathy:     Cervical: No cervical adenopathy.  Skin:    General: Skin is warm and dry.  Neurological:     Mental Status: She is alert and oriented to person, place, and time.  Psychiatric:        Behavior: Behavior normal.        Thought Content: Thought content normal.        Judgment: Judgment normal.      Assessment & Plan:   Kaylyne was seen today for toe injury.  Diagnoses and all orders for this visit:  Contusion of left foot, initial encounter -     DG Foot Complete Left; Future -     Ambulatory referral to Cardiology  Precordial pain  Gastroesophageal reflux disease with esophagitis without hemorrhage  Other orders -     ibuprofen (ADVIL) 800 MG tablet; Take 1 tablet (800 mg total) by mouth every 8 (eight) hours as needed. -     pantoprazole (PROTONIX) 40 MG tablet;  Take 1 tablet (40 mg total) by mouth daily. For stomach       I have discontinued Magdala Welle's oseltamivir. I am also having her start on ibuprofen and pantoprazole. Additionally, I am having her maintain her aspirin, fluocinonide-emollient, Dovato, amLODipine, atorvastatin, and fluocinonide cream.  Allergies as of 10/30/2021       Reactions   Efavirenz Dermatitis, Shortness Of Breath   Sulfa Antibiotics Rash   Cymbalta [duloxetine Hcl] Nausea Only   Causes insomnia   Nelfinavir    Other reaction(s): Other (See Comments) Mouth ulcers   Duloxetine Nausea Only        Medication List        Accurate as of Oct 30, 2021  9:42 PM. If you have any questions, ask your nurse or doctor.          STOP taking these medications    oseltamivir 75 MG capsule Commonly known as: Tamiflu Stopped by: Claretta Fraise, MD       TAKE these medications    amLODipine 5 MG tablet Commonly known as: NORVASC TAKE 1 TABLET BY MOUTH EVERY DAY FOR BLOOD PRESSURE   aspirin 81 MG chewable tablet Chew 81 mg by mouth.   atorvastatin 40 MG tablet Commonly known as: LIPITOR Take 1 tablet (40 mg total) by mouth daily.   Dovato 50-300 MG tablet Generic drug: dolutegravir-lamiVUDine Take 1 tablet by mouth daily.   fluocinonide cream 0.05 % Commonly known as: LIDEX Apply 1 application topically 2 (two) times daily.   fluocinonide-emollient 0.05 % cream Commonly known as: LIDEX-E APPLY SPARINGLY TO AFFECTED AREAS TWICE DAILY   ibuprofen 800 MG tablet Commonly known as: ADVIL Take 1 tablet (800 mg total) by mouth every 8 (eight) hours as needed. Started by: Claretta Fraise, MD   pantoprazole 40 MG tablet Commonly known as: PROTONIX Take 1 tablet (40 mg total) by mouth daily. For stomach Started by: Claretta Fraise, MD         Follow-up: Return in about 1 month (around 11/30/2021) for [facility] physical.  Claretta Fraise, M.D.

## 2021-10-31 DIAGNOSIS — M7981 Nontraumatic hematoma of soft tissue: Secondary | ICD-10-CM | POA: Diagnosis not present

## 2021-10-31 DIAGNOSIS — Z8674 Personal history of sudden cardiac arrest: Secondary | ICD-10-CM | POA: Diagnosis not present

## 2021-10-31 DIAGNOSIS — Z9889 Other specified postprocedural states: Secondary | ICD-10-CM | POA: Diagnosis not present

## 2021-10-31 DIAGNOSIS — I2119 ST elevation (STEMI) myocardial infarction involving other coronary artery of inferior wall: Secondary | ICD-10-CM | POA: Insufficient documentation

## 2021-10-31 DIAGNOSIS — I5189 Other ill-defined heart diseases: Secondary | ICD-10-CM | POA: Diagnosis not present

## 2021-10-31 DIAGNOSIS — D649 Anemia, unspecified: Secondary | ICD-10-CM | POA: Diagnosis not present

## 2021-10-31 DIAGNOSIS — E1151 Type 2 diabetes mellitus with diabetic peripheral angiopathy without gangrene: Secondary | ICD-10-CM | POA: Diagnosis not present

## 2021-10-31 DIAGNOSIS — Z79891 Long term (current) use of opiate analgesic: Secondary | ICD-10-CM | POA: Diagnosis not present

## 2021-10-31 DIAGNOSIS — T82855A Stenosis of coronary artery stent, initial encounter: Secondary | ICD-10-CM | POA: Diagnosis not present

## 2021-10-31 DIAGNOSIS — Z7982 Long term (current) use of aspirin: Secondary | ICD-10-CM | POA: Diagnosis not present

## 2021-10-31 DIAGNOSIS — F17210 Nicotine dependence, cigarettes, uncomplicated: Secondary | ICD-10-CM | POA: Diagnosis not present

## 2021-10-31 DIAGNOSIS — E785 Hyperlipidemia, unspecified: Secondary | ICD-10-CM | POA: Diagnosis not present

## 2021-10-31 DIAGNOSIS — I213 ST elevation (STEMI) myocardial infarction of unspecified site: Secondary | ICD-10-CM | POA: Diagnosis not present

## 2021-10-31 DIAGNOSIS — Z955 Presence of coronary angioplasty implant and graft: Secondary | ICD-10-CM | POA: Diagnosis not present

## 2021-10-31 DIAGNOSIS — I429 Cardiomyopathy, unspecified: Secondary | ICD-10-CM | POA: Diagnosis not present

## 2021-10-31 DIAGNOSIS — I21A9 Other myocardial infarction type: Secondary | ICD-10-CM | POA: Diagnosis not present

## 2021-10-31 DIAGNOSIS — R072 Precordial pain: Secondary | ICD-10-CM | POA: Diagnosis not present

## 2021-10-31 DIAGNOSIS — I2510 Atherosclerotic heart disease of native coronary artery without angina pectoris: Secondary | ICD-10-CM | POA: Diagnosis not present

## 2021-10-31 DIAGNOSIS — I10 Essential (primary) hypertension: Secondary | ICD-10-CM | POA: Diagnosis not present

## 2021-10-31 DIAGNOSIS — R5383 Other fatigue: Secondary | ICD-10-CM | POA: Diagnosis not present

## 2021-10-31 DIAGNOSIS — R079 Chest pain, unspecified: Secondary | ICD-10-CM | POA: Diagnosis not present

## 2021-10-31 DIAGNOSIS — R0602 Shortness of breath: Secondary | ICD-10-CM | POA: Diagnosis not present

## 2021-10-31 DIAGNOSIS — Z79899 Other long term (current) drug therapy: Secondary | ICD-10-CM | POA: Diagnosis not present

## 2021-11-08 DIAGNOSIS — E785 Hyperlipidemia, unspecified: Secondary | ICD-10-CM | POA: Diagnosis not present

## 2021-11-08 DIAGNOSIS — F17200 Nicotine dependence, unspecified, uncomplicated: Secondary | ICD-10-CM | POA: Diagnosis not present

## 2021-11-08 DIAGNOSIS — I2510 Atherosclerotic heart disease of native coronary artery without angina pectoris: Secondary | ICD-10-CM | POA: Diagnosis not present

## 2021-11-08 DIAGNOSIS — I10 Essential (primary) hypertension: Secondary | ICD-10-CM | POA: Diagnosis not present

## 2021-11-19 ENCOUNTER — Encounter: Payer: Self-pay | Admitting: Family Medicine

## 2021-11-19 ENCOUNTER — Ambulatory Visit (INDEPENDENT_AMBULATORY_CARE_PROVIDER_SITE_OTHER): Payer: Medicare Other | Admitting: Family Medicine

## 2021-11-19 VITALS — BP 139/85 | HR 59 | Temp 97.8°F | Ht 59.5 in | Wt 128.8 lb

## 2021-11-19 DIAGNOSIS — E782 Mixed hyperlipidemia: Secondary | ICD-10-CM

## 2021-11-19 DIAGNOSIS — Z0001 Encounter for general adult medical examination with abnormal findings: Secondary | ICD-10-CM | POA: Diagnosis not present

## 2021-11-19 DIAGNOSIS — Z1159 Encounter for screening for other viral diseases: Secondary | ICD-10-CM

## 2021-11-19 DIAGNOSIS — Z23 Encounter for immunization: Secondary | ICD-10-CM | POA: Diagnosis not present

## 2021-11-19 DIAGNOSIS — Z955 Presence of coronary angioplasty implant and graft: Secondary | ICD-10-CM

## 2021-11-19 DIAGNOSIS — Z0000 Encounter for general adult medical examination without abnormal findings: Secondary | ICD-10-CM | POA: Diagnosis not present

## 2021-11-19 DIAGNOSIS — I2510 Atherosclerotic heart disease of native coronary artery without angina pectoris: Secondary | ICD-10-CM | POA: Diagnosis not present

## 2021-11-19 DIAGNOSIS — I10 Essential (primary) hypertension: Secondary | ICD-10-CM

## 2021-11-19 MED ORDER — SENNA-DOCUSATE SODIUM 8.6-50 MG PO TABS: 1.0000 | ORAL_TABLET | Freq: Every day | ORAL | 2 refills | Status: DC

## 2021-11-19 NOTE — Progress Notes (Signed)
Subjective:  Patient ID: Gina Alexander, female    DOB: 1971-03-29  Age: 51 y.o. MRN: [id number]  CC: Annual Exam   HPI Gina Alexander presents for Annual exam. Had MI on 5/24. Said the angina felt like something in her throat that tasted like ashes. She had cath with stent. Right groin bled afterward. Had hematoma all the ay over to the labia and up into the RLQ. Cannot have a pap smear today due to the tenderness that remains. Wants to wait. It has been 3 or more years. No more angina sx. Did have some chest pains but checked in with Cards, Dr. Gwenlyn Found, who cleared her. Had a follow up with him about 10 dys ago.   Several meds changed. Now feeling very drowsy.      11/19/2021   10:06 AM 10/30/2021    2:38 PM 03/13/2020    4:29 PM  Depression screen PHQ 2/9  Decreased Interest 1 0 0  Down, Depressed, Hopeless 2 0 0  PHQ - 2 Score 3 0 0  Altered sleeping 1 0   Tired, decreased energy 0 2   Change in appetite 1 3   Feeling bad or failure about yourself   1   Trouble concentrating 0 0   Moving slowly or fidgety/restless 0 0   Suicidal thoughts 0 0   PHQ-9 Score 5 6   Difficult doing work/chores Somewhat difficult Somewhat difficult       11/19/2021   10:07 AM 10/30/2021    2:40 PM 02/18/2019    5:18 PM  GAD 7 : Generalized Anxiety Score  Nervous, Anxious, on Edge 2 0 2  Control/stop worrying 1 1 2   Worry too much - different things 1 3 2   Trouble relaxing 2 1 3   Restless 1 0 2  Easily annoyed or irritable 3 3 1   Afraid - awful might happen 2 0 2  Total GAD 7 Score 12 8 14   Anxiety Difficulty Somewhat difficult Somewhat difficult      History Gina Alexander has a past medical history of Depression, HIV infection ([place]), Hyperlipidemia, Hypertension, and Myocardial infarction ([place]).   She has a past surgical history that includes Cholecystectomy; Tubal ligation (2006); uterine ablation; and Breast enhancement surgery.   Her family history includes Arthritis in her mother; COPD  in her mother.She reports that she has been smoking cigarettes. She has been smoking an average of .5 packs per day. She quit smokeless tobacco use about 7 years ago. She reports current alcohol use of about 2.0 standard drinks of alcohol per week. She reports that she does not use drugs.    ROS Review of Systems  Constitutional:  Negative for appetite change, chills, diaphoresis, fatigue, fever and unexpected weight change.  HENT:  Negative for congestion, ear pain, hearing loss, postnasal drip, rhinorrhea, sneezing, sore throat and trouble swallowing.   Eyes:  Negative for pain.  Respiratory:  Negative for cough, chest tightness and shortness of breath.   Cardiovascular:  Positive for chest pain (resolved, but had MI on 5/24). Negative for palpitations.  Gastrointestinal:  Negative for abdominal pain, constipation, diarrhea, nausea and vomiting.  Endocrine: Negative for cold intolerance, heat intolerance, polydipsia, polyphagia and polyuria.  Genitourinary:  Negative for dysuria, frequency and menstrual problem.  Musculoskeletal:  Negative for arthralgias and joint swelling.  Skin:  Negative for rash.  Allergic/Immunologic: Negative for environmental allergies.  Neurological:  Negative for dizziness, weakness, numbness and headaches.  Psychiatric/Behavioral:  Negative for agitation and dysphoric mood.  The patient is nervous/anxious (now trying to quit smoking. Wearing 21 mg nicotine patch. the patch is helping with the sx.).     Objective:  BP 139/85   Pulse (!) 59   Temp 97.8 F (36.6 C)   Ht 4' 11.5" (1.511 m)   Wt 128 lb 12.8 oz (58.4 kg)   SpO2 96%   BMI 25.58 kg/m   BP Readings from Last 3 Encounters:  11/19/21 139/85  10/30/21 131/85  03/13/20 119/82    Wt Readings from Last 3 Encounters:  11/19/21 128 lb 12.8 oz (58.4 kg)  10/30/21 124 lb (56.2 kg)  03/13/20 121 lb (54.9 kg)     Physical Exam Constitutional:      General: She is not in acute distress.     Appearance: She is well-developed.  HENT:     Head: Normocephalic and atraumatic.  Eyes:     Conjunctiva/sclera: Conjunctivae normal.     Pupils: Pupils are equal, round, and reactive to light.  Neck:     Thyroid: No thyromegaly.  Cardiovascular:     Rate and Rhythm: Normal rate and regular rhythm.     Heart sounds: Normal heart sounds. No murmur heard. Pulmonary:     Effort: Pulmonary effort is normal. No respiratory distress.     Breath sounds: Normal breath sounds. No wheezing or rales.  Abdominal:     General: Bowel sounds are normal. There is no distension.     Palpations: Abdomen is soft.     Tenderness: There is no abdominal tenderness.  Musculoskeletal:        General: Normal range of motion.     Cervical back: Normal range of motion and neck supple.  Lymphadenopathy:     Cervical: No cervical adenopathy.  Skin:    General: Skin is warm and dry.  Neurological:     Mental Status: She is alert and oriented to person, place, and time.  Psychiatric:        Behavior: Behavior normal.        Thought Content: Thought content normal.        Judgment: Judgment normal.       Assessment & Plan:   Gina Alexander was seen today for annual exam.  Diagnoses and all orders for this visit:  Well adult exam -     BMP8+EGFR  Need for Tdap vaccination -     Tdap vaccine greater than or equal to 7yo IM  Essential hypertension -     Cancel: Brain natriuretic peptide -     CBC with Differential/Platelet -     BMP8+EGFR  Need for hepatitis C screening test -     Hepatitis C antibody  ASCVD (arteriosclerotic cardiovascular disease)  Mixed hyperlipidemia  Stented coronary artery  Other orders -     sennosides-docusate sodium (SENOKOT-S) 8.6-50 MG tablet; Take 1 tablet by mouth daily.       I have discontinued [employer] fluocinonide-emollient, amLODipine, fluocinonide cream, ibuprofen, and pantoprazole. I am also having her start on sennosides-docusate sodium.  Additionally, I am having her maintain her Dovato, aspirin, atorvastatin, losartan, metoprolol succinate, and prasugrel.  Allergies as of 11/19/2021       Reactions   Efavirenz Dermatitis, Shortness Of Breath   Sulfa Antibiotics Rash   Cymbalta [duloxetine Hcl] Nausea Only   Causes insomnia   Nelfinavir    Other reaction(s): Other (See Comments) Mouth ulcers   Duloxetine Nausea Only        Medication List  Accurate as of November 19, 2021 11:29 AM. If you have any questions, ask your nurse or doctor.          STOP taking these medications    amLODipine 5 MG tablet Commonly known as: NORVASC Stopped by: Claretta Fraise, MD   fluocinonide cream 0.05 % Commonly known as: LIDEX Stopped by: Claretta Fraise, MD   fluocinonide-emollient 0.05 % cream Commonly known as: LIDEX-E Stopped by: Claretta Fraise, MD   ibuprofen 800 MG tablet Commonly known as: ADVIL Stopped by: Claretta Fraise, MD   pantoprazole 40 MG tablet Commonly known as: PROTONIX Stopped by: Claretta Fraise, MD       TAKE these medications    aspirin 81 MG chewable tablet Chew by mouth. What changed: Another medication with the same name was removed. Continue taking this medication, and follow the directions you see here. Changed by: Claretta Fraise, MD   atorvastatin 80 MG tablet Commonly known as: LIPITOR Take 80 mg by mouth daily. What changed: Another medication with the same name was removed. Continue taking this medication, and follow the directions you see here. Changed by: Claretta Fraise, MD   Dovato 50-300 MG tablet Generic drug: dolutegravir-lamiVUDine Take 1 tablet by mouth daily.   losartan 25 MG tablet Commonly known as: COZAAR Take 12.5 mg by mouth daily.   metoprolol succinate 25 MG 24 hr tablet Commonly known as: TOPROL-XL Take 25 mg by mouth daily.   prasugrel 5 MG Tabs tablet Commonly known as: EFFIENT Take by mouth.   sennosides-docusate sodium 8.6-50 MG tablet Commonly  known as: SENOKOT-S Take 1 tablet by mouth daily. Started by: Claretta Fraise, MD       Drowsiness likely from meoprolol which is essential. No colonoscopy for a year due to use of effient post stent placement.   Follow-up: Return in about 3 months (around 02/19/2022) for GYN with pap.  Claretta Fraise, M.D.

## 2021-11-19 NOTE — Patient Instructions (Signed)

## 2021-11-20 LAB — CBC WITH DIFFERENTIAL/PLATELET
Basophils Absolute: 0.1 10*3/uL (ref 0.0–0.2)
Basos: 1 %
EOS (ABSOLUTE): 0.2 10*3/uL (ref 0.0–0.4)
Eos: 2 %
Hematocrit: 39.2 % (ref 34.0–46.6)
Hemoglobin: 13.7 g/dL (ref 11.1–15.9)
Immature Grans (Abs): 0.1 10*3/uL (ref 0.0–0.1)
Immature Granulocytes: 1 %
Lymphocytes Absolute: 3.3 10*3/uL — ABNORMAL HIGH (ref 0.7–3.1)
Lymphs: 37 %
MCH: 31.6 pg (ref 26.6–33.0)
MCHC: 34.9 g/dL (ref 31.5–35.7)
MCV: 90 fL (ref 79–97)
Monocytes Absolute: 0.6 10*3/uL (ref 0.1–0.9)
Monocytes: 7 %
Neutrophils Absolute: 4.7 10*3/uL (ref 1.4–7.0)
Neutrophils: 52 %
Platelets: 328 10*3/uL (ref 150–450)
RBC: 4.34 x10E6/uL (ref 3.77–5.28)
RDW: 12.5 % (ref 11.7–15.4)
WBC: 9.1 10*3/uL (ref 3.4–10.8)

## 2021-11-20 LAB — BMP8+EGFR
BUN/Creatinine Ratio: 13 (ref 9–23)
BUN: 10 mg/dL (ref 6–24)
CO2: 24 mmol/L (ref 20–29)
Calcium: 9.5 mg/dL (ref 8.7–10.2)
Chloride: 105 mmol/L (ref 96–106)
Creatinine, Ser: 0.79 mg/dL (ref 0.57–1.00)
Glucose: 105 mg/dL — ABNORMAL HIGH (ref 70–99)
Potassium: 4.2 mmol/L (ref 3.5–5.2)
Sodium: 143 mmol/L (ref 134–144)
eGFR: 92 mL/min/{1.73_m2} (ref >59)

## 2021-11-20 LAB — HEPATITIS C ANTIBODY: Hep C Virus Ab: NONREACTIVE

## 2021-11-20 NOTE — Progress Notes (Signed)
Hello Lenna,  Your lab result is normal and/or stable.Some minor variations that are not significant are commonly marked abnormal, but do not represent any medical problem for you.  Best regards, Imo Cumbie, M.D.

## 2021-12-12 ENCOUNTER — Ambulatory Visit (INDEPENDENT_AMBULATORY_CARE_PROVIDER_SITE_OTHER): Payer: Medicare Other

## 2021-12-12 VITALS — Ht 59.5 in | Wt 128.0 lb

## 2021-12-12 DIAGNOSIS — Z599 Problem related to housing and economic circumstances, unspecified: Secondary | ICD-10-CM

## 2021-12-12 DIAGNOSIS — Z0001 Encounter for general adult medical examination with abnormal findings: Secondary | ICD-10-CM

## 2021-12-12 DIAGNOSIS — I10 Essential (primary) hypertension: Secondary | ICD-10-CM

## 2021-12-12 DIAGNOSIS — Z1231 Encounter for screening mammogram for malignant neoplasm of breast: Secondary | ICD-10-CM

## 2021-12-12 DIAGNOSIS — H539 Unspecified visual disturbance: Secondary | ICD-10-CM

## 2021-12-12 DIAGNOSIS — Z0000 Encounter for general adult medical examination without abnormal findings: Secondary | ICD-10-CM

## 2021-12-12 DIAGNOSIS — Z5941 Food insecurity: Secondary | ICD-10-CM | POA: Diagnosis not present

## 2021-12-12 NOTE — Progress Notes (Signed)
Subjective:   Gina Alexander is a 51 y.o. female who presents for an Initial Medicare Annual Wellness Visit. Virtual Visit via Telephone Note  I connected with  Gina Alexander on 12/12/21 at  2:45 PM EDT by telephone and verified that I am speaking with the correct person using two identifiers.  Location: Patient: [facility] Provider: [facility] Persons participating in the virtual visit: patient/Nurse Health Advisor   I discussed the limitations, risks, security and privacy concerns of performing an evaluation and management service by telephone and the availability of in person appointments. The patient expressed understanding and agreed to proceed.  Interactive audio and video telecommunications were attempted between this nurse and patient, however failed, due to patient having technical difficulties OR patient did not have access to video capability.  We continued and completed visit with audio only.  Some vital signs may be absent or patient reported.   Darral Dash, LPN  Review of Systems     Cardiac Risk Factors include: advanced age (>69men, >6 women);hypertension;dyslipidemia;sedentary lifestyle;Other (see comment), Risk factor comments: ACUTE MI 10/2021.     Objective:    Today's Vitals   12/12/21 1446  Weight: 128 lb (58.1 kg)  Height: 4' 11.5" (1.511 m)   Body mass index is 25.42 kg/m.     12/12/2021    2:57 PM  Advanced Directives  Does Patient Have a Medical Advance Directive? No  Would patient like information on creating a medical advance directive? No - Patient declined    Current Medications (verified) Outpatient Encounter Medications as of 12/12/2021  Medication Sig   aspirin 81 MG chewable tablet Chew by mouth.   atorvastatin (LIPITOR) 80 MG tablet Take 80 mg by mouth daily.   Dolutegravir-lamiVUDine (DOVATO) 50-300 MG TABS Take 1 tablet by mouth daily.   losartan (COZAAR) 25 MG tablet Take 12.5 mg by mouth daily.   metoprolol succinate (TOPROL-XL) 25  MG 24 hr tablet Take 25 mg by mouth daily.   prasugrel (EFFIENT) 5 MG TABS tablet Take by mouth.   sennosides-docusate sodium (SENOKOT-S) 8.6-50 MG tablet Take 1 tablet by mouth daily.   No facility-administered encounter medications on file as of 12/12/2021.    Allergies (verified) Efavirenz, Sulfa antibiotics, Cymbalta [duloxetine hcl], Nelfinavir, and Duloxetine   History: Past Medical History:  Diagnosis Date   Depression    HIV infection (HCC)    Hyperlipidemia    Hypertension    Myocardial infarction [facility])    Past Surgical History:  Procedure Laterality Date   BREAST ENHANCEMENT SURGERY     CHOLECYSTECTOMY     TUBAL LIGATION  2006   uterine ablation     Family History  Problem Relation Age of Onset   Arthritis Mother    COPD Mother    Social History   Socioeconomic History   Marital status: Divorced    Spouse name: Not on file   Number of children: Not on file   Years of education: Not on file   Highest education level: Not on file  Occupational History   Not on file  Tobacco Use   Smoking status: Every Day    Packs/day: 0.50    Types: Cigarettes   Smokeless tobacco: Former    Quit date: 01/11/2014  Substance and Sexual Activity   Alcohol use: Yes    Alcohol/week: 2.0 standard drinks of alcohol    Types: 2 Standard drinks or equivalent per week   Drug use: No   Sexual activity: Not Currently  Other  Topics Concern   Not on file  Social History Narrative   Not on file   Social Determinants of Health   Financial Resource Strain: High Risk (12/12/2021)   Overall Financial Resource Strain (CARDIA)    Difficulty of Paying Living Expenses: Hard  Food Insecurity: Food Insecurity Present (12/12/2021)   Hunger Vital Sign    Worried About Running Out of Food in the Last Year: Sometimes true    Ran Out of Food in the Last Year: Sometimes true  Transportation Needs: No Transportation Needs (12/12/2021)   PRAPARE - Hydrologist (Medical):  No    Lack of Transportation (Non-Medical): No  Physical Activity: Inactive (12/12/2021)   Exercise Vital Sign    Days of Exercise per Week: 0 days    Minutes of Exercise per Session: 0 min  Stress: Stress Concern Present (12/12/2021)   [place]    Feeling of Stress : To some extent  Social Connections: Socially Isolated (12/12/2021)   Social Connection and Isolation Panel [NHANES]    Frequency of Communication with Friends and Family: More than three times a week    Frequency of Social Gatherings with Friends and Family: More than three times a week    Attends Religious Services: Never    Marine scientist or Organizations: No    Attends Music therapist: Never    Marital Status: Divorced    Tobacco Counseling Ready to quit: Not Answered Counseling given: Not Answered   Clinical Intake:  Pre-visit preparation completed: Yes  Pain : No/denies pain     BMI - recorded: 25.42 Nutritional Status: BMI 25 -29 Overweight Nutritional Risks: None Diabetes: No  How often do you need to have someone help you when you read instructions, pamphlets, or other written materials from your doctor or pharmacy?: 1 - Never  Diabetic?NO  Interpreter Needed?: No  Information entered by :: mj Lynasia Meloche, lpn   Activities of Daily Living    12/12/2021    3:00 PM 05/17/2021    8:51 AM  In your present state of health, do you have any difficulty performing the following activities:  Hearing? 0 0   0  Vision? 0 0   0  Difficulty concentrating or making decisions? 0 0   0  Walking or climbing stairs? 0 0   0  Dressing or bathing? 0 0   0  Doing errands, shopping? 0 0   0  Preparing Food and eating ? N N   N  Using the Toilet? N N   N  In the past six months, have you accidently leaked urine? Y Y   Y  Do you have problems with loss of bowel control? N N   N  Managing your Medications? N N   N   Managing your Finances? N N   N  Housekeeping or managing your Housekeeping? Carloyn Manner    Patient Care Team: Claretta Fraise, MD as PCP - General (Family Medicine)  Indicate any recent Medical Services you may have received from other than [facility] providers in the past year (date may be approximate).     Assessment:   This is a routine wellness examination for Gina Alexander.  Hearing/Vision screen Hearing Screening - Comments:: No hearing issues.  Vision Screening - Comments:: Glasses. Christmas. 2021.  Dietary issues and exercise activities discussed: Current Exercise Habits: The patient does not participate in regular  exercise at present, Exercise limited by: cardiac condition(s)   Goals Addressed             This Visit's Progress    Weight (lb) < 130 lb (59 kg)   128 lb (58.1 kg)    Would like to lose back to 120#       Depression Screen    12/12/2021    2:50 PM 11/19/2021   10:06 AM 10/30/2021    2:38 PM 03/13/2020    4:29 PM 08/24/2019    4:36 PM 02/18/2019    4:43 PM 08/19/2018    4:32 PM  PHQ 2/9 Scores  PHQ - 2 Score 1 3 0 0 0 0 0  PHQ- 9 Score 1 5 6         Fall Risk    12/12/2021    2:59 PM 11/19/2021   10:06 AM 05/17/2021    8:51 AM 08/24/2019    4:36 PM 02/18/2019    4:43 PM  Fall Risk   Falls in the past year? 0 0 0   0 0 0  Number falls in past yr: 0 0 0   0 0   Injury with Fall? 0 0 0   0 0   Risk for fall due to : No Fall Risks   No Fall Risks   Follow up Falls prevention discussed Falls evaluation completed  Falls evaluation completed     FALL RISK PREVENTION PERTAINING TO THE HOME:  Any stairs in or around the home? Yes  If so, are there any without handrails? No  Home free of loose throw rugs in walkways, pet beds, electrical cords, etc? Yes  Adequate lighting in your home to reduce risk of falls? Yes   ASSISTIVE DEVICES UTILIZED TO PREVENT FALLS:  Life alert? No  Use of a cane, walker or w/c? No  Grab bars in the bathroom? No  Shower  chair or bench in shower? Yes  Elevated toilet seat or a handicapped toilet? Yes   TIMED UP AND GO:  Was the test performed? No . Phone visit.  Cognitive Function:        12/12/2021    3:01 PM  6CIT Screen  What Year? 0 points  What month? 0 points  What time? 0 points  Count back from 20 0 points  Months in reverse 0 points  Repeat phrase 0 points  Total Score 0 points    Immunizations Immunization History  Administered Date(s) Administered   Influenza, Seasonal, Injecte, Preservative Fre 03/12/2012, 03/11/2016, 03/22/2017, 03/18/2018, 03/27/2019, 03/13/2020, 03/28/2021   Influenza,inj,Quad PF,6+ Mos 03/11/2016, 03/28/2021   Influenza-Unspecified 03/24/2013, 04/04/2014, 04/06/2015, 03/22/2017, 03/18/2018, 03/27/2019   Meningococcal Conjugate 12/20/2015   PFIZER Comirnaty(Gray Top)Covid-19 Tri-Sucrose Vaccine 06/29/2020   PFIZER(Purple Top)SARS-COV-2 Vaccination 06/13/2019, 07/04/2019, 06/29/2020   Pneumococcal Conjugate-13 01/23/2012, 02/18/2018   Pneumococcal Polysaccharide-23 11/29/2009, 02/16/2010   Td 01/25/2010   Td (Adult) 01/25/2010   Tdap 01/25/2010, 11/09/2010, 11/19/2021    TDAP status: Up to date  Flu Vaccine status: Up to date  Pneumococcal vaccine status: Up to date  Covid-19 vaccine status: Completed vaccines  Qualifies for Shingles Vaccine?  AT AGE 51   Zostavax completed No   Shingrix Completed?: No.    Education has been provided regarding the importance of this vaccine. Patient has been advised to call insurance company to determine out of pocket expense if they have not yet received this vaccine. Advised may also receive vaccine at local pharmacy or Health Dept. Verbalized acceptance and  understanding.  Screening Tests Health Maintenance  Topic Date Due   MAMMOGRAM  03/24/2021   PAP SMEAR-Modifier  02/07/2022 (Originally 03/13/2019)   COVID-19 Vaccine (5 - Booster for Pfizer series) 11/11/2022 (Originally 08/24/2020)   COLONOSCOPY (Pts 45-1yrs  Insurance coverage will need to be confirmed)  11/20/2022 (Originally 04/02/2017)   INFLUENZA VACCINE  01/08/2022   TETANUS/TDAP  11/20/2031   Hepatitis C Screening  Completed   HIV Screening  Completed   HPV VACCINES  Aged Out    Health Maintenance  Health Maintenance Due  Topic Date Due   MAMMOGRAM  03/24/2021    Colorectal cancer screening: No longer required.  Pt states she was advised by GI to wait 1 year post MI before scheduling. Will be due May 2024. Mammogram status: Ordered 12/12/2021. Pt provided with contact info and advised to call to schedule appt.   Bone Density status: Ordered due at age 73. Pt provided with contact info and advised to call to schedule appt.  Lung Cancer Screening: (Low Dose CT Chest recommended if Age 72-80 years, 30 pack-year currently smoking OR have quit w/in 15years.) does not qualify.   Additional Screening:  Hepatitis C Screening: does not qualify;  Vision Screening: Recommended annual ophthalmology exams for early detection of glaucoma and other disorders of the eye. Is the patient up to date with their annual eye exam?  No  Who is the provider or what is the name of the office in which the patient attends annual eye exams? [facility] If pt is not established with a provider, would they like to be referred to a provider to establish care? Yes .   Dental Screening: Recommended annual dental exams for proper oral hygiene  Community Resource Referral / Chronic Care Management: CRR required this visit?  Yes   CCM required this visit?  No      Plan:     I have personally reviewed and noted the following in the patient's chart:   Medical and social history Use of alcohol, tobacco or illicit drugs  Current medications and supplements including opioid prescriptions. Patient is not currently taking opioid prescriptions. Functional ability and status Nutritional status Physical activity Advanced directives List of other  physicians Hospitalizations, surgeries, and ER visits in previous 12 months Vitals Screenings to include cognitive, depression, and falls Referrals and appointments  In addition, I have reviewed and discussed with patient certain preventive protocols, quality metrics, and best practice recommendations. A written personalized care plan for preventive services as well as general preventive health recommendations were provided to patient.     Chriss Driver, LPN   [id number]   Nurse Notes: Pt c/o issues with being able to afford food at times. Discussed CRR referral. Pt Is agreeable and referral made. Pt would like to schedule Mammogram and would like Optometry referral. Both orders placed.

## 2021-12-12 NOTE — Patient Instructions (Signed)
Gina Alexander , Thank you for taking time to come for your Medicare Wellness Visit. I appreciate your ongoing commitment to your health goals. Please review the following plan we discussed and let me know if I can assist you in the future.   Screening recommendations/referrals: Colonoscopy: Discussed. Schedule in June 2024. Mammogram: Order placed today. Bone Density: Due at age 50.  Recommended yearly ophthalmology/optometry visit for glaucoma screening and checkup Recommended yearly dental visit for hygiene and checkup  Vaccinations: Influenza vaccine: Done 03/28/2021 Repeat annually  Pneumococcal vaccine: Done 02/16/2010, 01/23/2012 and 02/18/2018. Tdap vaccine: Done 11/19/2021 Repeat in 10 years  Shingles vaccine: Due after age 75.  Covid-19: Done 06/29/2020, 07/04/2019 and 06/13/2019.  Advanced directives: Advance directive discussed with you today. Even though you declined this today, please call our office should you change your mind, and we can give you the proper paperwork for you to fill out.   Conditions/risks identified: Aim for 30 minutes of exercise or brisk walking, 6-8 glasses of water, and 5 servings of fruits and vegetables each day.   Next appointment: Follow up in one year for your annual wellness visit. 2024.  Preventive Care 40-64 Years, Female Preventive care refers to lifestyle choices and visits with your health care provider that can promote health and wellness. What does preventive care include? A yearly physical exam. This is also called an annual well check. Dental exams once or twice a year. Routine eye exams. Ask your health care provider how often you should have your eyes checked. Personal lifestyle choices, including: Daily care of your teeth and gums. Regular physical activity. Eating a healthy diet. Avoiding tobacco and drug use. Limiting alcohol use. Practicing safe sex. Taking low-dose aspirin daily starting at age 48. Taking vitamin and mineral  supplements as recommended by your health care provider. What happens during an annual well check? The services and screenings done by your health care provider during your annual well check will depend on your age, overall health, lifestyle risk factors, and family history of disease. Counseling  Your health care provider may ask you questions about your: Alcohol use. Tobacco use. Drug use. Emotional well-being. Home and relationship well-being. Sexual activity. Eating habits. Work and work Statistician. Method of birth control. Menstrual cycle. Pregnancy history. Screening  You may have the following tests or measurements: Height, weight, and BMI. Blood pressure. Lipid and cholesterol levels. These may be checked every 5 years, or more frequently if you are over 59 years old. Skin check. Lung cancer screening. You may have this screening every year starting at age 33 if you have a 30-pack-year history of smoking and currently smoke or have quit within the past 15 years. Fecal occult blood test (FOBT) of the stool. You may have this test every year starting at age 16. Flexible sigmoidoscopy or colonoscopy. You may have a sigmoidoscopy every 5 years or a colonoscopy every 10 years starting at age 54. Hepatitis C blood test. Hepatitis B blood test. Sexually transmitted disease (STD) testing. Diabetes screening. This is done by checking your blood sugar (glucose) after you have not eaten for a while (fasting). You may have this done every 1-3 years. Mammogram. This may be done every 1-2 years. Talk to your health care provider about when you should start having regular mammograms. This may depend on whether you have a family history of breast cancer. BRCA-related cancer screening. This may be done if you have a family history of breast, ovarian, tubal, or peritoneal cancers. Pelvic exam  and Pap test. This may be done every 3 years starting at age 65. Starting at age 73, this may be done  every 5 years if you have a Pap test in combination with an HPV test. Bone density scan. This is done to screen for osteoporosis. You may have this scan if you are at high risk for osteoporosis. Discuss your test results, treatment options, and if necessary, the need for more tests with your health care provider. Vaccines  Your health care provider may recommend certain vaccines, such as: Influenza vaccine. This is recommended every year. Tetanus, diphtheria, and acellular pertussis (Tdap, Td) vaccine. You may need a Td booster every 10 years. Zoster vaccine. You may need this after age 79. Pneumococcal 13-valent conjugate (PCV13) vaccine. You may need this if you have certain conditions and were not previously vaccinated. Pneumococcal polysaccharide (PPSV23) vaccine. You may need one or two doses if you smoke cigarettes or if you have certain conditions. Talk to your health care provider about which screenings and vaccines you need and how often you need them. This information is not intended to replace advice given to you by your health care provider. Make sure you discuss any questions you have with your health care provider. Document Released: 06/23/2015 Document Revised: 02/14/2016 Document Reviewed: 03/28/2015 Elsevier Interactive Patient Education  2017 [employer] Prevention in the Home Falls can cause injuries. They can happen to people of all ages. There are many things you can do to make your home safe and to help prevent falls. What can I do on the outside of my home? Regularly fix the edges of walkways and driveways and fix any cracks. Remove anything that might make you trip as you walk through a door, such as a raised step or threshold. Trim any bushes or trees on the path to your home. Use bright outdoor lighting. Clear any walking paths of anything that might make someone trip, such as rocks or tools. Regularly check to see if handrails are loose or broken. Make  sure that both sides of any steps have handrails. Any raised decks and porches should have guardrails on the edges. Have any leaves, snow, or ice cleared regularly. Use sand or salt on walking paths during winter. Clean up any spills in your garage right away. This includes oil or grease spills. What can I do in the bathroom? Use night lights. Install grab bars by the toilet and in the tub and shower. Do not use towel bars as grab bars. Use non-skid mats or decals in the tub or shower. If you need to sit down in the shower, use a plastic, non-slip stool. Keep the floor dry. Clean up any water that spills on the floor as soon as it happens. Remove soap buildup in the tub or shower regularly. Attach bath mats securely with double-sided non-slip rug tape. Do not have throw rugs and other things on the floor that can make you trip. What can I do in the bedroom? Use night lights. Make sure that you have a light by your bed that is easy to reach. Do not use any sheets or blankets that are too big for your bed. They should not hang down onto the floor. Have a firm chair that has side arms. You can use this for support while you get dressed. Do not have throw rugs and other things on the floor that can make you trip. What can I do in the kitchen? Clean up  any spills right away. Avoid walking on wet floors. Keep items that you use a lot in easy-to-reach places. If you need to reach something above you, use a strong step stool that has a grab bar. Keep electrical cords out of the way. Do not use floor polish or wax that makes floors slippery. If you must use wax, use non-skid floor wax. Do not have throw rugs and other things on the floor that can make you trip. What can I do with my stairs? Do not leave any items on the stairs. Make sure that there are handrails on both sides of the stairs and use them. Fix handrails that are broken or loose. Make sure that handrails are as long as the  stairways. Check any carpeting to make sure that it is firmly attached to the stairs. Fix any carpet that is loose or worn. Avoid having throw rugs at the top or bottom of the stairs. If you do have throw rugs, attach them to the floor with carpet tape. Make sure that you have a light switch at the top of the stairs and the bottom of the stairs. If you do not have them, ask someone to add them for you. What else can I do to help prevent falls? Wear shoes that: Do not have high heels. Have rubber bottoms. Are comfortable and fit you well. Are closed at the toe. Do not wear sandals. If you use a stepladder: Make sure that it is fully opened. Do not climb a closed stepladder. Make sure that both sides of the stepladder are locked into place. Ask someone to hold it for you, if possible. Clearly mark and make sure that you can see: Any grab bars or handrails. First and last steps. Where the edge of each step is. Use tools that help you move around (mobility aids) if they are needed. These include: Canes. Walkers. Scooters. Crutches. Turn on the lights when you go into a dark area. Replace any light bulbs as soon as they burn out. Set up your furniture so you have a clear path. Avoid moving your furniture around. If any of your floors are uneven, fix them. If there are any pets around you, be aware of where they are. Review your medicines with your doctor. Some medicines can make you feel dizzy. This can increase your chance of falling. Ask your doctor what other things that you can do to help prevent falls. This information is not intended to replace advice given to you by your health care provider. Make sure you discuss any questions you have with your health care provider. Document Released: 03/23/2009 Document Revised: 11/02/2015 Document Reviewed: 07/01/2014 Elsevier Interactive Patient Education  2017 [employer].

## 2021-12-14 ENCOUNTER — Telehealth: Payer: Self-pay | Admitting: *Deleted

## 2021-12-14 NOTE — Telephone Encounter (Signed)
   Telephone encounter was:  Unsuccessful.  12/14/2021 Name: Gina Alexander MRN: [id number] DOB: 04-07-1971  Unsuccessful outbound call made today to assist with:  Food Insecurity  Outreach Attempt:  1st Attempt  A HIPAA compliant voice message was left requesting a return call.  Instructed patient to call back at   Instructed patient to call back at [phone]  at their earliest convenience. Yehuda Mao Greenauer -[facility] Guide , Embedded Care Coordination [facility], Care Management  [phone] [address] [place] , [place] [address] Email : [email or web address]

## 2021-12-17 ENCOUNTER — Telehealth: Payer: Self-pay | Admitting: *Deleted

## 2021-12-17 NOTE — Telephone Encounter (Signed)
   Telephone encounter was:  Unsuccessful.  12/17/2021 Name: Gina Alexander MRN: [id number] DOB: Jan 15, 1971  Unsuccessful outbound call made today to assist with:  Food Insecurity  Outreach Attempt:  2nd Attempt  A HIPAA compliant voice message was left requesting a return call.  Instructed patient to call back at   Instructed patient to call back at [phone]  at their earliest convenience. Yehuda Mao Greenauer -[facility] Guide , Embedded Care Coordination [facility], Care Management  [phone] [address] [place] , [place] [address] Email : [email or web address]

## 2021-12-19 ENCOUNTER — Telehealth: Payer: Self-pay | Admitting: *Deleted

## 2021-12-19 NOTE — Telephone Encounter (Signed)
   Telephone encounter was:  Unsuccessful.  12/19/2021 Name: Gina Alexander MRN: [id number] DOB: Mar 06, 1971  Unsuccessful outbound call made today to assist with:  Food Insecurity  Outreach Attempt: 3rd attempt  A HIPAA compliant voice message was left requesting a return call.  Instructed patient to call back at   Instructed patient to call back at [phone]  at their earliest convenience.  Alois Cliche -[facility] Guide , Embedded Care Coordination [facility], Care Management  [phone] [address] [place] , [place] [address] Email : [email or web address]

## 2021-12-19 NOTE — Telephone Encounter (Signed)
   Telephone encounter was:  Successful.  12/19/2021 Name: Gina Alexander MRN: [id number] DOB: 01/14/1971  Gina Alexander is a 51 y.o. year old female who is a primary care patient of Stacks, Broadus John, MD . The community resource team was consulted for assistance with Food Insecurity  Care guide performed the following interventions: Patient provided with information about care guide support team and interviewed to confirm resource needs.  Follow Up Plan:  No further follow up planned at this time. The patient has been provided with needed resources.  Alois Cliche -[facility] Guide , Embedded Care Coordination [facility], Care Management  [phone] [address] [place] , [place] [address] Email : [email or web address]

## 2022-01-21 ENCOUNTER — Encounter: Payer: Medicare Other | Admitting: Family Medicine

## 2022-02-20 ENCOUNTER — Ambulatory Visit (INDEPENDENT_AMBULATORY_CARE_PROVIDER_SITE_OTHER): Payer: Medicare Other | Admitting: Family Medicine

## 2022-02-20 ENCOUNTER — Other Ambulatory Visit (HOSPITAL_COMMUNITY)
Admission: RE | Admit: 2022-02-20 | Discharge: 2022-02-20 | Disposition: A | Discharge status: Home / Self Care | Payer: Medicare Other | Source: Ambulatory Visit | Attending: Family Medicine | Admitting: Family Medicine

## 2022-02-20 ENCOUNTER — Encounter: Payer: Self-pay | Admitting: Family Medicine

## 2022-02-20 VITALS — BP 129/73 | Temp 97.6°F | Ht 59.5 in | Wt 135.0 lb

## 2022-02-20 DIAGNOSIS — Z0000 Encounter for general adult medical examination without abnormal findings: Secondary | ICD-10-CM | POA: Diagnosis not present

## 2022-02-20 DIAGNOSIS — Z136 Encounter for screening for cardiovascular disorders: Secondary | ICD-10-CM | POA: Diagnosis not present

## 2022-02-20 DIAGNOSIS — Z955 Presence of coronary angioplasty implant and graft: Secondary | ICD-10-CM | POA: Diagnosis not present

## 2022-02-20 DIAGNOSIS — F3341 Major depressive disorder, recurrent, in partial remission: Secondary | ICD-10-CM

## 2022-02-20 DIAGNOSIS — I10 Essential (primary) hypertension: Secondary | ICD-10-CM

## 2022-02-20 DIAGNOSIS — Z0001 Encounter for general adult medical examination with abnormal findings: Secondary | ICD-10-CM | POA: Diagnosis not present

## 2022-02-20 DIAGNOSIS — R87612 Low grade squamous intraepithelial lesion on cytologic smear of cervix (LGSIL): Secondary | ICD-10-CM | POA: Insufficient documentation

## 2022-02-20 DIAGNOSIS — E782 Mixed hyperlipidemia: Secondary | ICD-10-CM

## 2022-02-20 DIAGNOSIS — I2510 Atherosclerotic heart disease of native coronary artery without angina pectoris: Secondary | ICD-10-CM

## 2022-02-20 DIAGNOSIS — N951 Menopausal and female climacteric states: Secondary | ICD-10-CM

## 2022-02-20 LAB — URINALYSIS
Bilirubin, UA: NEGATIVE
Glucose, UA: NEGATIVE
Ketones, UA: NEGATIVE
Leukocytes,UA: NEGATIVE
Nitrite, UA: NEGATIVE
RBC, UA: NEGATIVE
Specific Gravity, UA: >1.03 — ABNORMAL HIGH (ref 1.005–1.030)
Urobilinogen, Ur: 1 mg/dL (ref 0.2–1.0)
pH, UA: 5 (ref 5.0–7.5)

## 2022-02-20 MED ORDER — REMIFEMIN MENOPAUSE RELIEF 21 MG PO TABS: ORAL_TABLET | ORAL | 99 refills | Status: DC

## 2022-02-20 MED ORDER — PANTOPRAZOLE SODIUM 40 MG PO TBEC: 40.0000 mg | DELAYED_RELEASE_TABLET | Freq: Every day | ORAL | 11 refills | Status: DC

## 2022-02-20 NOTE — Progress Notes (Signed)
Subjective:  Patient ID: Gina Alexander, female    DOB: 1971/05/22  Age: 51 y.o. MRN: [id number]  CC: Annual Exam   HPI Gina Alexander presents for Annual exam. New job, plus 2 nights a week at another. Working another on Saturdays.   Recent MI. Has stopped smoking since (2 mos.) exercising regularly. Taking antianginal. Statin and effient     02/20/2022   10:05 AM 02/20/2022    9:50 AM 12/12/2021    2:50 PM  Depression screen PHQ 2/9  Decreased Interest 0 0 0  Down, Depressed, Hopeless 1 0 1  PHQ - 2 Score 1 0 1  Altered sleeping 0  0  Tired, decreased energy 0  0  Change in appetite 3  0  Feeling bad or failure about yourself  1  0  Trouble concentrating 1  0  Moving slowly or fidgety/restless 0  0  Suicidal thoughts 0  0  PHQ-9 Score 6  1  Difficult doing work/chores Not difficult at all  Not difficult at all    History Gina Alexander has a past medical history of Depression, HIV infection ([place]), Hyperlipidemia, Hypertension, and Myocardial infarction ([place]).   Gina Alexander has a past surgical history that includes Cholecystectomy; Tubal ligation (2006); uterine ablation; and Breast enhancement surgery.   Her family history includes Arthritis in her mother; COPD in her mother.Gina Alexander reports that Gina Alexander has been smoking cigarettes. Gina Alexander has been smoking an average of .5 packs per day. Gina Alexander quit smokeless tobacco use about 8 years ago. Gina Alexander reports current alcohol use of about 2.0 standard drinks of alcohol per week. Gina Alexander reports that Gina Alexander does not use drugs.    ROS Review of Systems  Constitutional:  Negative for appetite change, chills, diaphoresis, fatigue, fever and unexpected weight change.  HENT:  Negative for congestion, ear pain, hearing loss, postnasal drip, rhinorrhea, sneezing, sore throat and trouble swallowing.   Eyes:  Negative for pain.  Respiratory:  Negative for cough, chest tightness and shortness of breath.   Cardiovascular:  Negative for chest pain and palpitations.   Gastrointestinal:  Negative for abdominal pain, constipation, diarrhea, nausea and vomiting.  Endocrine: Negative for cold intolerance, heat intolerance, polydipsia, polyphagia and polyuria.  Genitourinary:  Negative for dysuria, frequency and menstrual problem.  Musculoskeletal:  Negative for arthralgias and joint swelling.  Skin:  Negative for rash.  Allergic/Immunologic: Negative for environmental allergies.  Neurological:  Negative for dizziness, weakness, numbness and headaches.  Psychiatric/Behavioral:  Negative for agitation and dysphoric mood.     Objective:  BP 129/73   Temp 97.6 F (36.4 C)   Ht 4' 11.5" (1.511 m)   Wt 135 lb (61.2 kg)   SpO2 98%   BMI 26.81 kg/m   BP Readings from Last 3 Encounters:  02/20/22 129/73  11/19/21 139/85  10/30/21 131/85    Wt Readings from Last 3 Encounters:  02/20/22 135 lb (61.2 kg)  12/12/21 128 lb (58.1 kg)  11/19/21 128 lb 12.8 oz (58.4 kg)     Physical Exam Exam conducted with a chaperone present.  Constitutional:      General: Gina Alexander is not in acute distress.    Appearance: Normal appearance. Gina Alexander is well-developed.  HENT:     Head: Normocephalic and atraumatic.     Right Ear: External ear normal.     Left Ear: External ear normal.     Nose: Nose normal.  Eyes:     Conjunctiva/sclera: Conjunctivae normal.     Pupils: Pupils are equal, round,  and reactive to light.  Neck:     Thyroid: No thyromegaly.  Cardiovascular:     Rate and Rhythm: Normal rate and regular rhythm.     Heart sounds: Normal heart sounds. No murmur heard. Pulmonary:     Effort: Pulmonary effort is normal. No respiratory distress.     Breath sounds: Normal breath sounds. No wheezing or rales.  Chest:  Breasts:    Breasts are symmetrical.     Right: No inverted nipple, mass or tenderness.     Left: No inverted nipple, mass or tenderness.  Abdominal:     General: Bowel sounds are normal. There is no distension or abdominal bruit.     Palpations:  Abdomen is soft. There is no hepatomegaly, splenomegaly or mass.     Tenderness: There is no abdominal tenderness. Negative signs include Murphy's sign and McBurney's sign.  Genitourinary:    General: Normal vulva.     Exam position: Lithotomy position.     Pubic Area: No rash.      Labia:        Right: No tenderness or lesion.        Left: No tenderness or lesion.      Vagina: Normal. No foreign body. No erythema, tenderness, bleeding or lesions.     Cervix: No cervical motion tenderness, discharge, lesion, erythema or cervical bleeding.  Musculoskeletal:        General: No tenderness. Normal range of motion.     Cervical back: Normal range of motion and neck supple.  Lymphadenopathy:     Cervical: No cervical adenopathy.  Skin:    General: Skin is warm and dry.     Findings: No rash.  Neurological:     Mental Status: Gina Alexander is alert and oriented to person, place, and time.     Deep Tendon Reflexes: Reflexes are normal and symmetric.  Psychiatric:        Behavior: Behavior normal.        Thought Content: Thought content normal.        Judgment: Judgment normal.       Assessment & Plan:   Gina Alexander was seen today for annual exam.  Diagnoses and all orders for this visit:  Well adult exam -     CBC with Differential/Platelet -     CMP14+EGFR -     Lipid panel -     Urinalysis  ASCVD (arteriosclerotic cardiovascular disease)  Stented coronary artery  Essential hypertension  Recurrent major depressive disorder, in partial remission ([place])  Mixed hyperlipidemia  Papanicolaou smear of cervix with low grade squamous intraepithelial lesion (LGSIL) -     Cytology - PAP([place])  Menopause syndrome -     FSH/LH  Other orders -     pantoprazole (PROTONIX) 40 MG tablet; Take 1 tablet (40 mg total) by mouth daily. For stomach -     REMIFEMIN MENOPAUSE RELIEF 21 MG TABS; USe daily as dircted on label       I am having Gina Alexander start on pantoprazole and  Remifemin Menopause Relief. I am also having her maintain her Dovato, aspirin, atorvastatin, losartan, metoprolol succinate, prasugrel, and sennosides-docusate sodium.  Allergies as of 02/20/2022       Reactions   Efavirenz Dermatitis, Shortness Of Breath   Sulfa Antibiotics Rash   Cymbalta [duloxetine Hcl] Nausea Only   Causes insomnia   Nelfinavir    Other reaction(s): Other (See Comments) Mouth ulcers   Duloxetine Nausea Only  Medication List        Accurate as of February 20, 2022  3:15 PM. If you have any questions, ask your nurse or doctor.          aspirin 81 MG chewable tablet Chew by mouth.   atorvastatin 80 MG tablet Commonly known as: LIPITOR Take 80 mg by mouth daily.   Dovato 50-300 MG tablet Generic drug: dolutegravir-lamiVUDine Take 1 tablet by mouth daily.   losartan 25 MG tablet Commonly known as: COZAAR Take 12.5 mg by mouth daily.   metoprolol succinate 25 MG 24 hr tablet Commonly known as: TOPROL-XL Take 25 mg by mouth daily.   pantoprazole 40 MG tablet Commonly known as: PROTONIX Take 1 tablet (40 mg total) by mouth daily. For stomach Started by: Claretta Fraise, MD   prasugrel 5 MG Tabs tablet Commonly known as: EFFIENT Take by mouth.   Remifemin Menopause Relief 21 MG Tabs Generic drug: Black Cohosh USe daily as dircted on label Started by: Claretta Fraise, MD   sennosides-docusate sodium 8.6-50 MG tablet Commonly known as: SENOKOT-S Take 1 tablet by mouth daily.         Follow-up: Return in about 3 months (around 05/22/2022), or if symptoms worsen or fail to improve.  Claretta Fraise, M.D.

## 2022-02-21 LAB — CMP14+EGFR
ALT: 18 IU/L (ref 0–32)
AST: 16 IU/L (ref 0–40)
Albumin/Globulin Ratio: 1.6 (ref 1.2–2.2)
Albumin: 4.5 g/dL (ref 3.9–4.9)
Alkaline Phosphatase: 120 IU/L (ref 44–121)
BUN/Creatinine Ratio: 9 (ref 9–23)
BUN: 8 mg/dL (ref 6–24)
Bilirubin Total: 0.5 mg/dL (ref 0.0–1.2)
CO2: 23 mmol/L (ref 20–29)
Calcium: 9.5 mg/dL (ref 8.7–10.2)
Chloride: 104 mmol/L (ref 96–106)
Creatinine, Ser: 0.9 mg/dL (ref 0.57–1.00)
Globulin, Total: 2.8 g/dL (ref 1.5–4.5)
Glucose: 106 mg/dL — ABNORMAL HIGH (ref 70–99)
Potassium: 3.8 mmol/L (ref 3.5–5.2)
Sodium: 141 mmol/L (ref 134–144)
Total Protein: 7.3 g/dL (ref 6.0–8.5)
eGFR: 78 mL/min/{1.73_m2} (ref >59)

## 2022-02-21 LAB — CBC WITH DIFFERENTIAL/PLATELET
Basophils Absolute: 0.1 10*3/uL (ref 0.0–0.2)
Basos: 1 %
EOS (ABSOLUTE): 0.2 10*3/uL (ref 0.0–0.4)
Eos: 2 %
Hematocrit: 45 % (ref 34.0–46.6)
Hemoglobin: 15.2 g/dL (ref 11.1–15.9)
Immature Grans (Abs): 0 10*3/uL (ref 0.0–0.1)
Immature Granulocytes: 0 %
Lymphocytes Absolute: 4.2 10*3/uL — ABNORMAL HIGH (ref 0.7–3.1)
Lymphs: 43 %
MCH: 31.1 pg (ref 26.6–33.0)
MCHC: 33.8 g/dL (ref 31.5–35.7)
MCV: 92 fL (ref 79–97)
Monocytes Absolute: 0.6 10*3/uL (ref 0.1–0.9)
Monocytes: 6 %
Neutrophils Absolute: 4.8 10*3/uL (ref 1.4–7.0)
Neutrophils: 48 %
Platelets: 243 10*3/uL (ref 150–450)
RBC: 4.88 x10E6/uL (ref 3.77–5.28)
RDW: 12.9 % (ref 11.7–15.4)
WBC: 9.9 10*3/uL (ref 3.4–10.8)

## 2022-02-21 LAB — LIPID PANEL
Chol/HDL Ratio: 3.8 ratio (ref 0.0–4.4)
Cholesterol, Total: 106 mg/dL (ref 100–199)
HDL: 28 mg/dL — ABNORMAL LOW (ref >39)
LDL Chol Calc (NIH): 50 mg/dL (ref 0–99)
Triglycerides: 161 mg/dL — ABNORMAL HIGH (ref 0–149)
VLDL Cholesterol Cal: 28 mg/dL (ref 5–40)

## 2022-02-21 LAB — FSH/LH
FSH: 68.8 m[IU]/mL
LH: 38.4 m[IU]/mL

## 2022-02-21 NOTE — Progress Notes (Signed)
Hello Renia,  Your lab result is normal and/or stable.Some minor variations that are not significant are commonly marked abnormal, but do not represent any medical problem for you.  Best regards, Arlett Goold, M.D.

## 2022-02-25 LAB — CYTOLOGY - PAP: Diagnosis: NEGATIVE

## 2022-02-25 NOTE — Progress Notes (Signed)
Your recent Pap smear performed in the office came back normal.

## 2022-02-28 DIAGNOSIS — I10 Essential (primary) hypertension: Secondary | ICD-10-CM | POA: Diagnosis not present

## 2022-02-28 DIAGNOSIS — I2119 ST elevation (STEMI) myocardial infarction involving other coronary artery of inferior wall: Secondary | ICD-10-CM | POA: Diagnosis not present

## 2022-02-28 DIAGNOSIS — I2510 Atherosclerotic heart disease of native coronary artery without angina pectoris: Secondary | ICD-10-CM | POA: Diagnosis not present

## 2022-02-28 DIAGNOSIS — E785 Hyperlipidemia, unspecified: Secondary | ICD-10-CM | POA: Diagnosis not present

## 2022-04-24 DIAGNOSIS — G2581 Restless legs syndrome: Secondary | ICD-10-CM | POA: Diagnosis not present

## 2022-04-24 DIAGNOSIS — Z23 Encounter for immunization: Secondary | ICD-10-CM | POA: Diagnosis not present

## 2022-04-24 DIAGNOSIS — F17210 Nicotine dependence, cigarettes, uncomplicated: Secondary | ICD-10-CM | POA: Diagnosis not present

## 2022-04-24 DIAGNOSIS — I10 Essential (primary) hypertension: Secondary | ICD-10-CM | POA: Diagnosis not present

## 2022-04-24 DIAGNOSIS — I2510 Atherosclerotic heart disease of native coronary artery without angina pectoris: Secondary | ICD-10-CM | POA: Diagnosis not present

## 2022-04-24 DIAGNOSIS — Z79899 Other long term (current) drug therapy: Secondary | ICD-10-CM | POA: Diagnosis not present

## 2022-05-17 DIAGNOSIS — R85610 Atypical squamous cells of undetermined significance on cytologic smear of anus (ASC-US): Secondary | ICD-10-CM | POA: Diagnosis not present

## 2022-05-17 DIAGNOSIS — Z006 Encounter for examination for normal comparison and control in clinical research program: Secondary | ICD-10-CM | POA: Diagnosis not present

## 2022-05-22 ENCOUNTER — Encounter: Payer: Self-pay | Admitting: Family Medicine

## 2022-05-22 ENCOUNTER — Ambulatory Visit (INDEPENDENT_AMBULATORY_CARE_PROVIDER_SITE_OTHER): Payer: Medicare Other | Admitting: Family Medicine

## 2022-05-22 VITALS — BP 138/82 | HR 64 | Temp 97.9°F | Ht 59.5 in | Wt 138.2 lb

## 2022-05-22 DIAGNOSIS — E782 Mixed hyperlipidemia: Secondary | ICD-10-CM | POA: Diagnosis not present

## 2022-05-22 DIAGNOSIS — I2510 Atherosclerotic heart disease of native coronary artery without angina pectoris: Secondary | ICD-10-CM | POA: Diagnosis not present

## 2022-05-22 DIAGNOSIS — I10 Essential (primary) hypertension: Secondary | ICD-10-CM

## 2022-05-22 DIAGNOSIS — R1084 Generalized abdominal pain: Secondary | ICD-10-CM | POA: Diagnosis not present

## 2022-05-22 DIAGNOSIS — Z21 Asymptomatic human immunodeficiency virus [HIV] infection status: Secondary | ICD-10-CM | POA: Diagnosis not present

## 2022-05-22 LAB — URINALYSIS
Bilirubin, UA: NEGATIVE
Glucose, UA: NEGATIVE
Ketones, UA: NEGATIVE
Leukocytes,UA: NEGATIVE
Nitrite, UA: NEGATIVE
RBC, UA: NEGATIVE
Specific Gravity, UA: >1.03 — ABNORMAL HIGH (ref 1.005–1.030)
Urobilinogen, Ur: 0.2 mg/dL (ref 0.2–1.0)
pH, UA: 5 (ref 5.0–7.5)

## 2022-05-22 MED ORDER — PANTOPRAZOLE SODIUM 40 MG PO TBEC: 40.0000 mg | DELAYED_RELEASE_TABLET | Freq: Two times a day (BID) | ORAL | 3 refills | Status: AC

## 2022-05-22 NOTE — Progress Notes (Signed)
Subjective:  Patient ID: Gina Alexander, female    DOB: [birth year]  Age: 51 y.o. MRN: [id number]  CC: Medical Management of Chronic Issues   HPI Gina Alexander presents for  presents for  follow-up of hypertension. Patient has no history of headache chest pain or shortness of breath or recent cough. Patient also denies symptoms of TIA such as focal numbness or weakness. Patient denies side effects from medication. States taking it regularly.  Patient in for follow-up of GERD. Currently asymptomatic taking  PPI daily. There is no chest pain or heartburn. No hematemesis and no melena. No dysphagia or choking. Onset is remote. Progression is stable. Complicating factors, none.  Recent bloodwork shows HIV to be undetectable.    in for follow-up of elevated cholesterol. Doing well without complaints on current medication. Denies side effects of statin including myalgia and arthralgia and nausea. Currently no chest pain, shortness of breath or other cardiovascular related symptoms noted.  Switched from effient to plavix due to cost.      05/22/2022   10:47 AM 02/20/2022   10:05 AM 02/20/2022    9:50 AM  Depression screen PHQ 2/9  Decreased Interest 0 0 0  Down, Depressed, Hopeless 0 1 0  PHQ - 2 Score 0 1 0  Altered sleeping  0   Tired, decreased energy  0   Change in appetite  3   Feeling bad or failure about yourself   1   Trouble concentrating  1   Moving slowly or fidgety/restless  0   Suicidal thoughts  0   PHQ-9 Score  6   Difficult doing work/chores  Not difficult at all     History Gina Alexander has a past medical history of Depression, HIV infection ([place]), Hyperlipidemia, Hypertension, and Myocardial infarction ([place]).   She has a past surgical history that includes Cholecystectomy; Tubal ligation (2006); uterine ablation; and Breast enhancement surgery.   Her family history includes Arthritis in her mother; COPD in her mother.She reports that she has been smoking cigarettes.  She has been smoking an average of .5 packs per day. She quit smokeless tobacco use about 8 years ago. She reports current alcohol use of about 2.0 standard drinks of alcohol per week. She reports that she does not use drugs.    ROS Review of Systems  Constitutional: Negative.   HENT: Negative.    Eyes:  Negative for visual disturbance.  Respiratory:  Negative for shortness of breath.   Cardiovascular:  Negative for chest pain.  Gastrointestinal:  Negative for abdominal pain.  Musculoskeletal:  Negative for arthralgias.    Objective:  BP 138/82   Pulse 64   Temp 97.9 F (36.6 C)   Ht 4' 11.5" (1.511 m)   Wt 138 lb 3.2 oz (62.7 kg)   SpO2 98%   BMI 27.45 kg/m   BP Readings from Last 3 Encounters:  05/22/22 138/82  02/20/22 129/73  11/19/21 139/85    Wt Readings from Last 3 Encounters:  05/22/22 138 lb 3.2 oz (62.7 kg)  02/20/22 135 lb (61.2 kg)  12/12/21 128 lb (58.1 kg)     Physical Exam Constitutional:      General: She is not in acute distress.    Appearance: She is well-developed.  Cardiovascular:     Rate and Rhythm: Normal rate and regular rhythm.  Pulmonary:     Breath sounds: Normal breath sounds.  Musculoskeletal:        General: Normal range of motion.  Skin:  General: Skin is warm and dry.  Neurological:     Mental Status: She is alert and oriented to person, place, and time.       Assessment & Plan:   Gina Alexander was seen today for medical management of chronic issues.  Diagnoses and all orders for this visit:  Generalized abdominal pain -     Ambulatory referral to Gastroenterology -     CBC with Differential/Platelet -     CMP14+EGFR -     Urinalysis  Asymptomatic HIV infection ([place])  Essential hypertension  Mixed hyperlipidemia  ASCVD (arteriosclerotic cardiovascular disease)  Other orders -     pantoprazole (PROTONIX) 40 MG tablet; Take 1 tablet (40 mg total) by mouth 2 (two) times daily. For stomach       I have  discontinued [name]'s prasugrel. I have also changed her pantoprazole. Additionally, I am having her maintain her Dovato, aspirin, atorvastatin, losartan, metoprolol succinate, sennosides-docusate sodium, and Remifemin Menopause Relief.  Allergies as of 05/22/2022       Reactions   Efavirenz Dermatitis, Shortness Of Breath   Sulfa Antibiotics Rash   Cymbalta [duloxetine Hcl] Nausea Only   Causes insomnia   Nelfinavir    Other reaction(s): Other (See Comments) Mouth ulcers   Duloxetine Nausea Only        Medication List        Accurate as of May 22, 2022  9:57 PM. If you have any questions, ask your nurse or doctor.          STOP taking these medications    prasugrel 5 MG Tabs tablet Commonly known as: EFFIENT Stopped by: Claretta Fraise, MD       TAKE these medications    aspirin 81 MG chewable tablet Chew by mouth.   atorvastatin 80 MG tablet Commonly known as: LIPITOR Take 80 mg by mouth daily.   Dovato 50-300 MG tablet Generic drug: dolutegravir-lamiVUDine Take 1 tablet by mouth daily.   losartan 25 MG tablet Commonly known as: COZAAR Take 12.5 mg by mouth daily.   metoprolol succinate 25 MG 24 hr tablet Commonly known as: TOPROL-XL Take 25 mg by mouth daily.   pantoprazole 40 MG tablet Commonly known as: PROTONIX Take 1 tablet (40 mg total) by mouth 2 (two) times daily. For stomach What changed: when to take this Changed by: Claretta Fraise, MD   Remifemin Menopause Relief 21 MG Tabs Generic drug: Black Cohosh USe daily as dircted on label   sennosides-docusate sodium 8.6-50 MG tablet Commonly known as: SENOKOT-S Take 1 tablet by mouth daily.         Follow-up: Return in about 6 months (around 11/21/2022).  Claretta Fraise, M.D.

## 2022-05-23 LAB — CBC WITH DIFFERENTIAL/PLATELET
Basophils Absolute: 0.1 10*3/uL (ref 0.0–0.2)
Basos: 1 %
EOS (ABSOLUTE): 0.1 10*3/uL (ref 0.0–0.4)
Eos: 1 %
Hematocrit: 45.6 % (ref 34.0–46.6)
Hemoglobin: 15.5 g/dL (ref 11.1–15.9)
Immature Grans (Abs): 0 10*3/uL (ref 0.0–0.1)
Immature Granulocytes: 0 %
Lymphocytes Absolute: 3.5 10*3/uL — ABNORMAL HIGH (ref 0.7–3.1)
Lymphs: 35 %
MCH: 30.9 pg (ref 26.6–33.0)
MCHC: 34 g/dL (ref 31.5–35.7)
MCV: 91 fL (ref 79–97)
Monocytes Absolute: 0.7 10*3/uL (ref 0.1–0.9)
Monocytes: 7 %
Neutrophils Absolute: 5.5 10*3/uL (ref 1.4–7.0)
Neutrophils: 56 %
Platelets: 277 10*3/uL (ref 150–450)
RBC: 5.02 x10E6/uL (ref 3.77–5.28)
RDW: 12.8 % (ref 11.7–15.4)
WBC: 10 10*3/uL (ref 3.4–10.8)

## 2022-05-23 LAB — CMP14+EGFR
ALT: 15 IU/L (ref 0–32)
AST: 16 IU/L (ref 0–40)
Albumin/Globulin Ratio: 1.4 (ref 1.2–2.2)
Albumin: 4 g/dL (ref 3.9–4.9)
Alkaline Phosphatase: 90 IU/L (ref 44–121)
BUN/Creatinine Ratio: 11 (ref 9–23)
BUN: 9 mg/dL (ref 6–24)
Bilirubin Total: 0.6 mg/dL (ref 0.0–1.2)
CO2: 25 mmol/L (ref 20–29)
Calcium: 9.2 mg/dL (ref 8.7–10.2)
Chloride: 102 mmol/L (ref 96–106)
Creatinine, Ser: 0.83 mg/dL (ref 0.57–1.00)
Globulin, Total: 2.8 g/dL (ref 1.5–4.5)
Glucose: 98 mg/dL (ref 70–99)
Potassium: 4.1 mmol/L (ref 3.5–5.2)
Sodium: 141 mmol/L (ref 134–144)
Total Protein: 6.8 g/dL (ref 6.0–8.5)
eGFR: 86 mL/min/{1.73_m2} (ref >59)

## 2022-05-26 NOTE — Progress Notes (Signed)
Hello Porsha,  Your lab result is normal and/or stable.Some minor variations that are not significant are commonly marked abnormal, but do not represent any medical problem for you.  Best regards, Marta Bouie, M.D.

## 2022-05-29 DIAGNOSIS — R1012 Left upper quadrant pain: Secondary | ICD-10-CM | POA: Diagnosis not present

## 2022-05-29 DIAGNOSIS — R1011 Right upper quadrant pain: Secondary | ICD-10-CM | POA: Diagnosis not present

## 2022-05-29 DIAGNOSIS — K219 Gastro-esophageal reflux disease without esophagitis: Secondary | ICD-10-CM | POA: Diagnosis not present

## 2022-05-29 DIAGNOSIS — I10 Essential (primary) hypertension: Secondary | ICD-10-CM | POA: Diagnosis not present

## 2022-05-29 DIAGNOSIS — R1010 Upper abdominal pain, unspecified: Secondary | ICD-10-CM | POA: Diagnosis not present

## 2022-05-29 DIAGNOSIS — Z9049 Acquired absence of other specified parts of digestive tract: Secondary | ICD-10-CM | POA: Diagnosis not present

## 2022-05-31 DIAGNOSIS — H6691 Otitis media, unspecified, right ear: Secondary | ICD-10-CM | POA: Diagnosis not present

## 2022-05-31 DIAGNOSIS — R0981 Nasal congestion: Secondary | ICD-10-CM | POA: Diagnosis not present

## 2022-05-31 DIAGNOSIS — I10 Essential (primary) hypertension: Secondary | ICD-10-CM | POA: Diagnosis not present

## 2022-05-31 DIAGNOSIS — R059 Cough, unspecified: Secondary | ICD-10-CM | POA: Diagnosis not present

## 2022-06-04 ENCOUNTER — Telehealth: Payer: Self-pay | Admitting: Family Medicine

## 2022-06-04 NOTE — Telephone Encounter (Signed)
Patient was seen in ER, only wants to see Stacks because she said her insurance charges different if it is a different provider. Made appt for 1/30 but needs to be worked in sooner. Please call back and advise.

## 2022-06-04 NOTE — Telephone Encounter (Signed)
Appt scheduled for 1/4 at 9:40 am with Stacks. States that she was diagnosed with pneumonia and ruptured both ear drums. Questioned if needed to see hearing specialist. Advised that if we need to place referral that she would need to be seen but if her insurance did not require referral then she may call the office she wishes to schedule an appt.

## 2022-06-08 DIAGNOSIS — Z9049 Acquired absence of other specified parts of digestive tract: Secondary | ICD-10-CM | POA: Diagnosis not present

## 2022-06-08 DIAGNOSIS — R1010 Upper abdominal pain, unspecified: Secondary | ICD-10-CM | POA: Diagnosis not present

## 2022-06-08 DIAGNOSIS — K7689 Other specified diseases of liver: Secondary | ICD-10-CM | POA: Diagnosis not present

## 2022-06-13 ENCOUNTER — Ambulatory Visit (INDEPENDENT_AMBULATORY_CARE_PROVIDER_SITE_OTHER): Payer: Medicare Other | Admitting: Family Medicine

## 2022-06-13 ENCOUNTER — Encounter: Payer: Self-pay | Admitting: Family Medicine

## 2022-06-13 VITALS — BP 144/78 | HR 63 | Temp 97.7°F | Ht 59.5 in | Wt 135.6 lb

## 2022-06-13 DIAGNOSIS — H748X3 Other specified disorders of middle ear and mastoid, bilateral: Secondary | ICD-10-CM | POA: Diagnosis not present

## 2022-06-13 DIAGNOSIS — N951 Menopausal and female climacteric states: Secondary | ICD-10-CM

## 2022-06-13 NOTE — Progress Notes (Signed)
Subjective:  Patient ID: Gina Alexander, female    DOB: 08/11/1970  Age: 52 y.o. MRN: [id number]  CC: Follow-up (Pneumonia and bilateral ruptured ear drum)   HPI Gina Alexander presents for follow up of ear infections. Hearing is muffled. Had bleeding from each ear at onset. That has cleared with antibioitcs- amoxil & doxy goven at [facility] in [place]. Bp went to 171/101     06/13/2022    9:52 AM 06/13/2022    9:45 AM 05/22/2022   10:47 AM  Depression screen PHQ 2/9  Decreased Interest 0 0 0  Down, Depressed, Hopeless 1 0 0  PHQ - 2 Score 1 0 0  Altered sleeping 0    Tired, decreased energy 0    Change in appetite 2    Feeling bad or failure about yourself  2    Trouble concentrating 0    Moving slowly or fidgety/restless 0    Suicidal thoughts 0    PHQ-9 Score 5    Difficult doing work/chores Not difficult at all      History Gina Alexander has a past medical history of Depression, HIV infection ([place]), Hyperlipidemia, Hypertension, and Myocardial infarction ([place]).   She has a past surgical history that includes Cholecystectomy; Tubal ligation (2006); uterine ablation; and Breast enhancement surgery.   Her family history includes Arthritis in her mother; COPD in her mother.She reports that she has been smoking cigarettes. She has been smoking an average of .5 packs per day. She quit smokeless tobacco use about 8 years ago. She reports current alcohol use of about 2.0 standard drinks of alcohol per week. She reports that she does not use drugs.    ROS Review of Systems  Constitutional: Negative.  Negative for fever.  HENT:  Positive for ear pain and hearing loss (muffled).   Eyes:  Negative for visual disturbance.  Respiratory:  Negative for shortness of breath.   Cardiovascular:  Negative for chest pain.  Gastrointestinal:  Negative for abdominal pain.  Genitourinary:  Positive for menstrual problem (irregular, hot flashes. Can't ake hormones due to HIV drugs).  Musculoskeletal:   Negative for arthralgias.    Objective:  BP (!) 144/78   Pulse 63   Temp 97.7 F (36.5 C)   Ht 4' 11.5" (1.511 m)   Wt 135 lb 9.6 oz (61.5 kg)   SpO2 99%   BMI 26.93 kg/m   BP Readings from Last 3 Encounters:  06/13/22 (!) 144/78  05/22/22 138/82  02/20/22 129/73    Wt Readings from Last 3 Encounters:  06/13/22 135 lb 9.6 oz (61.5 kg)  05/22/22 138 lb 3.2 oz (62.7 kg)  02/20/22 135 lb (61.2 kg)     Physical Exam Constitutional:      General: She is not in acute distress.    Appearance: She is well-developed.  HENT:     Ears:     Comments: Small amount of blood at each tympanum. Minimal in the canals, old. Cardiovascular:     Rate and Rhythm: Normal rate and regular rhythm.  Pulmonary:     Breath sounds: Normal breath sounds.  Musculoskeletal:        General: Normal range of motion.  Skin:    General: Skin is warm and dry.  Neurological:     Mental Status: She is alert and oriented to person, place, and time.       Assessment & Plan:   Gina Alexander was seen today for follow-up.  Diagnoses and all orders for this visit:  Hemotympanum, bilateral -     Ambulatory referral to ENT  Menopausal hot flushes       I am having Gina Alexander maintain her Dovato, aspirin, atorvastatin, losartan, metoprolol succinate, sennosides-docusate sodium, Remifemin Menopause Relief, and pantoprazole.  Allergies as of 06/13/2022       Reactions   Efavirenz Dermatitis, Shortness Of Breath   Sulfa Antibiotics Rash   Cymbalta [duloxetine Hcl] Nausea Only   Causes insomnia   Nelfinavir    Other reaction(s): Other (See Comments) Mouth ulcers   Duloxetine Nausea Only        Medication List        Accurate as of June 13, 2022 11:59 PM. If you have any questions, ask your nurse or doctor.          aspirin 81 MG chewable tablet Chew by mouth.   atorvastatin 80 MG tablet Commonly known as: LIPITOR Take 80 mg by mouth daily.   Dovato 50-300 MG  tablet Generic drug: dolutegravir-lamiVUDine Take 1 tablet by mouth daily.   losartan 25 MG tablet Commonly known as: COZAAR Take 12.5 mg by mouth daily.   metoprolol succinate 25 MG 24 hr tablet Commonly known as: TOPROL-XL Take 25 mg by mouth daily.   pantoprazole 40 MG tablet Commonly known as: PROTONIX Take 1 tablet (40 mg total) by mouth 2 (two) times daily. For stomach   Remifemin Menopause Relief 21 MG Tabs Generic drug: Black Cohosh USe daily as dircted on label   sennosides-docusate sodium 8.6-50 MG tablet Commonly known as: SENOKOT-S Take 1 tablet by mouth daily.       To use remifemin for now. Consider Nuzyra.  Follow-up: routine follow up  Claretta Fraise, M.D.

## 2022-06-17 ENCOUNTER — Encounter: Payer: Self-pay | Admitting: Family Medicine

## 2022-06-27 DIAGNOSIS — R1013 Epigastric pain: Secondary | ICD-10-CM | POA: Diagnosis not present

## 2022-06-27 DIAGNOSIS — K2950 Unspecified chronic gastritis without bleeding: Secondary | ICD-10-CM | POA: Diagnosis not present

## 2022-06-27 DIAGNOSIS — K3189 Other diseases of stomach and duodenum: Secondary | ICD-10-CM | POA: Diagnosis not present

## 2022-07-09 ENCOUNTER — Ambulatory Visit: Payer: Medicare Other | Admitting: Family Medicine

## 2022-07-09 DIAGNOSIS — J069 Acute upper respiratory infection, unspecified: Secondary | ICD-10-CM | POA: Diagnosis not present

## 2022-07-09 DIAGNOSIS — H7293 Unspecified perforation of tympanic membrane, bilateral: Secondary | ICD-10-CM | POA: Diagnosis not present

## 2022-07-09 DIAGNOSIS — H938X3 Other specified disorders of ear, bilateral: Secondary | ICD-10-CM | POA: Diagnosis not present

## 2022-08-02 DIAGNOSIS — E785 Hyperlipidemia, unspecified: Secondary | ICD-10-CM | POA: Diagnosis not present

## 2022-08-02 DIAGNOSIS — Z888 Allergy status to other drugs, medicaments and biological substances status: Secondary | ICD-10-CM | POA: Diagnosis not present

## 2022-08-02 DIAGNOSIS — I10 Essential (primary) hypertension: Secondary | ICD-10-CM | POA: Diagnosis not present

## 2022-08-02 DIAGNOSIS — Z7982 Long term (current) use of aspirin: Secondary | ICD-10-CM | POA: Diagnosis not present

## 2022-08-02 DIAGNOSIS — E119 Type 2 diabetes mellitus without complications: Secondary | ICD-10-CM | POA: Diagnosis not present

## 2022-08-02 DIAGNOSIS — Z951 Presence of aortocoronary bypass graft: Secondary | ICD-10-CM | POA: Diagnosis not present

## 2022-08-02 DIAGNOSIS — Z79899 Other long term (current) drug therapy: Secondary | ICD-10-CM | POA: Diagnosis not present

## 2022-08-02 DIAGNOSIS — K3182 Dieulafoy lesion (hemorrhagic) of stomach and duodenum: Secondary | ICD-10-CM | POA: Diagnosis not present

## 2022-08-02 DIAGNOSIS — K3189 Other diseases of stomach and duodenum: Secondary | ICD-10-CM | POA: Diagnosis not present

## 2022-08-02 DIAGNOSIS — I2510 Atherosclerotic heart disease of native coronary artery without angina pectoris: Secondary | ICD-10-CM | POA: Diagnosis not present

## 2022-08-02 DIAGNOSIS — Z882 Allergy status to sulfonamides status: Secondary | ICD-10-CM | POA: Diagnosis not present

## 2022-08-02 DIAGNOSIS — I252 Old myocardial infarction: Secondary | ICD-10-CM | POA: Diagnosis not present

## 2022-08-02 DIAGNOSIS — F17210 Nicotine dependence, cigarettes, uncomplicated: Secondary | ICD-10-CM | POA: Diagnosis not present

## 2022-08-02 DIAGNOSIS — Z7902 Long term (current) use of antithrombotics/antiplatelets: Secondary | ICD-10-CM | POA: Diagnosis not present

## 2022-08-02 DIAGNOSIS — K219 Gastro-esophageal reflux disease without esophagitis: Secondary | ICD-10-CM | POA: Diagnosis not present

## 2022-08-22 ENCOUNTER — Ambulatory Visit: Payer: Medicare Other | Admitting: Family Medicine

## 2022-08-23 ENCOUNTER — Encounter: Payer: Self-pay | Admitting: Family Medicine

## 2022-11-22 NOTE — Progress Notes (Signed)
[facility] Quality Team Note  Name: Gina Alexander Date of Birth: [birth year] MRN: [id number] Date: 11/22/2022  [facility] Quality Team has reviewed this patient's chart, please see recommendations below:  [facility] Quality Other; (Called patient to offer [place] eye event 11/27/2022, left voicemail.)

## 2022-11-26 ENCOUNTER — Encounter: Payer: Self-pay | Admitting: Nurse Practitioner

## 2022-11-26 ENCOUNTER — Telehealth (INDEPENDENT_AMBULATORY_CARE_PROVIDER_SITE_OTHER): Payer: Medicare Other | Admitting: Nurse Practitioner

## 2022-11-26 DIAGNOSIS — R399 Unspecified symptoms and signs involving the genitourinary system: Secondary | ICD-10-CM

## 2022-11-26 MED ORDER — NITROFURANTOIN MONOHYD MACRO 100 MG PO CAPS: 100.0000 mg | ORAL_CAPSULE | Freq: Two times a day (BID) | ORAL | 0 refills | Status: DC

## 2022-11-26 NOTE — Patient Instructions (Signed)
  [name], thank you for joining Bennie Pierini, FNP for today's virtual visit.  While this provider is not your primary care provider (PCP), if your PCP is located in our provider database this encounter information will be shared with them immediately following your visit.   A [facility] MyChart account gives you access to today's visit and all your visits, tests, and labs performed at [facility] " click here if you don't have a [place] MyChart account or go to [email or web address]  Consent: (Patient) Gina Alexander provided verbal consent for this virtual visit at the beginning of the encounter.  Current Medications:  Current Outpatient Medications:    nitrofurantoin, macrocrystal-monohydrate, (MACROBID) 100 MG capsule, Take 1 capsule (100 mg total) by mouth 2 (two) times daily. 1 po BId, Disp: 10 capsule, Rfl: 0   atorvastatin (LIPITOR) 80 MG tablet, Take 80 mg by mouth daily., Disp: , Rfl:    Dolutegravir-lamiVUDine (DOVATO) 50-300 MG TABS, Take 1 tablet by mouth daily., Disp: , Rfl:    losartan (COZAAR) 25 MG tablet, Take 12.5 mg by mouth daily., Disp: , Rfl:    metoprolol succinate (TOPROL-XL) 25 MG 24 hr tablet, Take 25 mg by mouth daily., Disp: , Rfl:    pantoprazole (PROTONIX) 40 MG tablet, Take 1 tablet (40 mg total) by mouth 2 (two) times daily. For stomach, Disp: 180 tablet, Rfl: 3   REMIFEMIN MENOPAUSE RELIEF 21 MG TABS, USe daily as dircted on label, Disp: 30 tablet, Rfl: PRN   sennosides-docusate sodium (SENOKOT-S) 8.6-50 MG tablet, Take 1 tablet by mouth daily., Disp: 60 tablet, Rfl: 2   Medications ordered in this encounter:  Meds ordered this encounter  Medications   nitrofurantoin, macrocrystal-monohydrate, (MACROBID) 100 MG capsule    Sig: Take 1 capsule (100 mg total) by mouth 2 (two) times daily. 1 po BId    Dispense:  10 capsule    Refill:  0    Order Specific Question:   Supervising Provider    Answer:   Arville Care A  [[id number]]     *If you need refills on other medications prior to your next appointment, please contact your pharmacy*  Follow-Up: Call back or seek an in-person evaluation if the symptoms worsen or if the condition fails to improve as anticipated.  [facility] [phone]  Other Instructions Take medication as prescribe Cotton underwear Take shower not bath Cranberry juice, yogurt Force fluids AZO over the counter X2 days RTO prn    If you have been instructed to have an in-person evaluation today at a local Urgent Care facility, please use the link below. It will take you to a list of all of our available [facility], including address, phone number and hours of operation. Please do not delay care.  [facility]  If you or a family member do not have a primary care provider, use the link below to schedule a visit and establish care. When you choose a [facility] primary care physician or advanced practice provider, you gain a long-term partner in health. Find a Primary Care Provider  Learn more about [place]'s in-office and virtual care options: [facility] - Get Care Now

## 2022-11-26 NOTE — Progress Notes (Signed)
Virtual Visit Consent   Gina Alexander, you are scheduled for a virtual visit with Mary-Margaret Daphine Deutscher, FNP, a [facility] provider, today.     Just as with appointments in the office, your consent must be obtained to participate.  Your consent will be active for this visit and any virtual visit you may have with one of our providers in the next 365 days.     If you have a MyChart account, a copy of this consent can be sent to you electronically.  All virtual visits are billed to your insurance company just like a traditional visit in the office.    As this is a virtual visit, video technology does not allow for your provider to perform a traditional examination.  This may limit your provider's ability to fully assess your condition.  If your provider identifies any concerns that need to be evaluated in person or the need to arrange testing (such as labs, EKG, etc.), we will make arrangements to do so.     Although advances in technology are sophisticated, we cannot ensure that it will always work on either your end or our end.  If the connection with a video visit is poor, the visit may have to be switched to a telephone visit.  With either a video or telephone visit, we are not always able to ensure that we have a secure connection.     I need to obtain your verbal consent now.   Are you willing to proceed with your visit today? YES   Iliya Jessup has provided verbal consent on 11/26/2022 for a virtual visit (video or telephone).   Mary-Margaret Daphine Deutscher, FNP   Date: 11/26/2022 2:16 PM   Virtual Visit via Video Note   I, Mary-Margaret Daphine Deutscher, connected with Gina Alexander ([id number], Nov 10, 1970) on 11/26/22 at  4:45 PM EDT by a video-enabled telemedicine application and verified that I am speaking with the correct person using two identifiers.  Location: Patient: Virtual Visit Location Patient: Home Provider: Virtual Visit Location Provider: Mobile   I discussed the limitations  of evaluation and management by telemedicine and the availability of in person appointments. The patient expressed understanding and agreed to proceed.    History of Present Illness: Gina Alexander is a 52 y.o. who identifies as a female who was assigned female at birth, and is being seen today for uti .  HPI: Urinary Tract Infection  This is a new problem. The current episode started in the past 7 days. The problem occurs every urination. The problem has been rapidly worsening. The quality of the pain is described as burning. The pain is at a severity of 5/10. The pain is moderate. There has been no fever. She is Sexually active. There is No history of pyelonephritis. Associated symptoms include frequency, hesitancy and urgency. Pertinent negatives include no chills, discharge or flank pain. She has tried nothing for the symptoms. The treatment provided no relief.    Review of Systems  Constitutional:  Negative for chills.  Genitourinary:  Positive for frequency, hesitancy and urgency. Negative for flank pain.    Problems:  Patient Active Problem List   Diagnosis Date Noted   Stented coronary artery 11/19/2021   ASCVD (arteriosclerotic cardiovascular disease) 11/19/2021   Acute inferolateral myocardial infarction [facility]) 10/31/2021   Essential hypertension 02/28/2016   Depression 02/28/2016   Asymptomatic HIV infection (HCC) 12/13/2014   Hyperlipidemia 12/13/2014   Papanicolaou smear of cervix with low grade squamous intraepithelial lesion (LGSIL) 03/25/2011  Allergies:  Allergies  Allergen Reactions   Efavirenz Dermatitis and Shortness Of Breath   Sulfa Antibiotics Rash   Cymbalta [Duloxetine Hcl] Nausea Only    Causes insomnia   Nelfinavir     Other reaction(s): Other (See Comments) Mouth ulcers   Duloxetine Nausea Only   Medications:  Current Outpatient Medications:    atorvastatin (LIPITOR) 80 MG tablet, Take 80 mg by mouth daily., Disp: , Rfl:     Dolutegravir-lamiVUDine (DOVATO) 50-300 MG TABS, Take 1 tablet by mouth daily., Disp: , Rfl:    losartan (COZAAR) 25 MG tablet, Take 12.5 mg by mouth daily., Disp: , Rfl:    metoprolol succinate (TOPROL-XL) 25 MG 24 hr tablet, Take 25 mg by mouth daily., Disp: , Rfl:    pantoprazole (PROTONIX) 40 MG tablet, Take 1 tablet (40 mg total) by mouth 2 (two) times daily. For stomach, Disp: 180 tablet, Rfl: 3   REMIFEMIN MENOPAUSE RELIEF 21 MG TABS, USe daily as dircted on label, Disp: 30 tablet, Rfl: PRN   sennosides-docusate sodium (SENOKOT-S) 8.6-50 MG tablet, Take 1 tablet by mouth daily., Disp: 60 tablet, Rfl: 2  Observations/Objective: Patient is well-developed, well-nourished in no acute distress.  Resting comfortably  at home.  Head is normocephalic, atraumatic.  No labored breathing.  Speech is clear and coherent with logical content.  Patient is alert and oriented at baseline.  No cva tenderness  Assessment and Plan:  Vedha Cata in today with chief complaint of Urinary Tract Infection   1. UTI symptoms Take medication as prescribe Cotton underwear Take shower not bath Cranberry juice, yogurt Force fluids AZO over the counter X2 days RTO prn    Follow Up Instructions: I discussed the assessment and treatment plan with the patient. The patient was provided an opportunity to ask questions and all were answered. The patient agreed with the plan and demonstrated an understanding of the instructions.  A copy of instructions were sent to the patient via MyChart.  The patient was advised to call back or seek an in-person evaluation if the symptoms worsen or if the condition fails to improve as anticipated.  Time:  I spent 5 minutes with the patient via telehealth technology discussing the above problems/concerns.    Mary-Margaret Daphine Deutscher, FNP

## 2022-12-23 ENCOUNTER — Other Ambulatory Visit: Payer: Self-pay | Admitting: Family Medicine

## 2022-12-23 NOTE — Telephone Encounter (Signed)
Last OV 06/13/22. Last RF historical provider . Next OV none scheduled at this moment

## 2023-01-22 IMAGING — DX DG FOOT COMPLETE 3+V*L*
3 series · 3 of 3 positions shown · non-contrast
Comparison: None Available.

CLINICAL DATA: Status post trauma to the fifth left toe.

EXAM:
LEFT FOOT - COMPLETE 3+ VIEW

[foot ap]
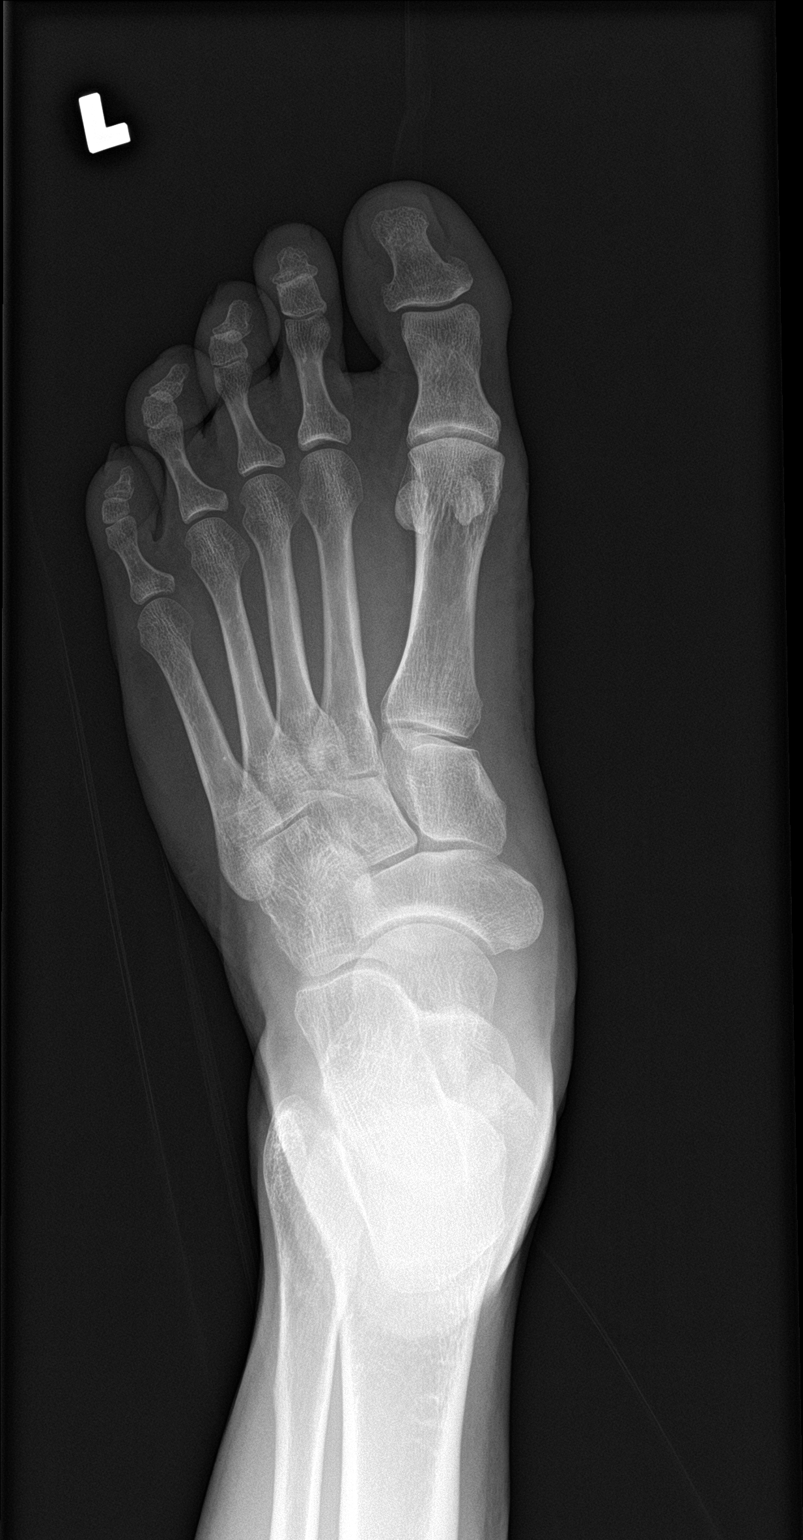

[foot obl]
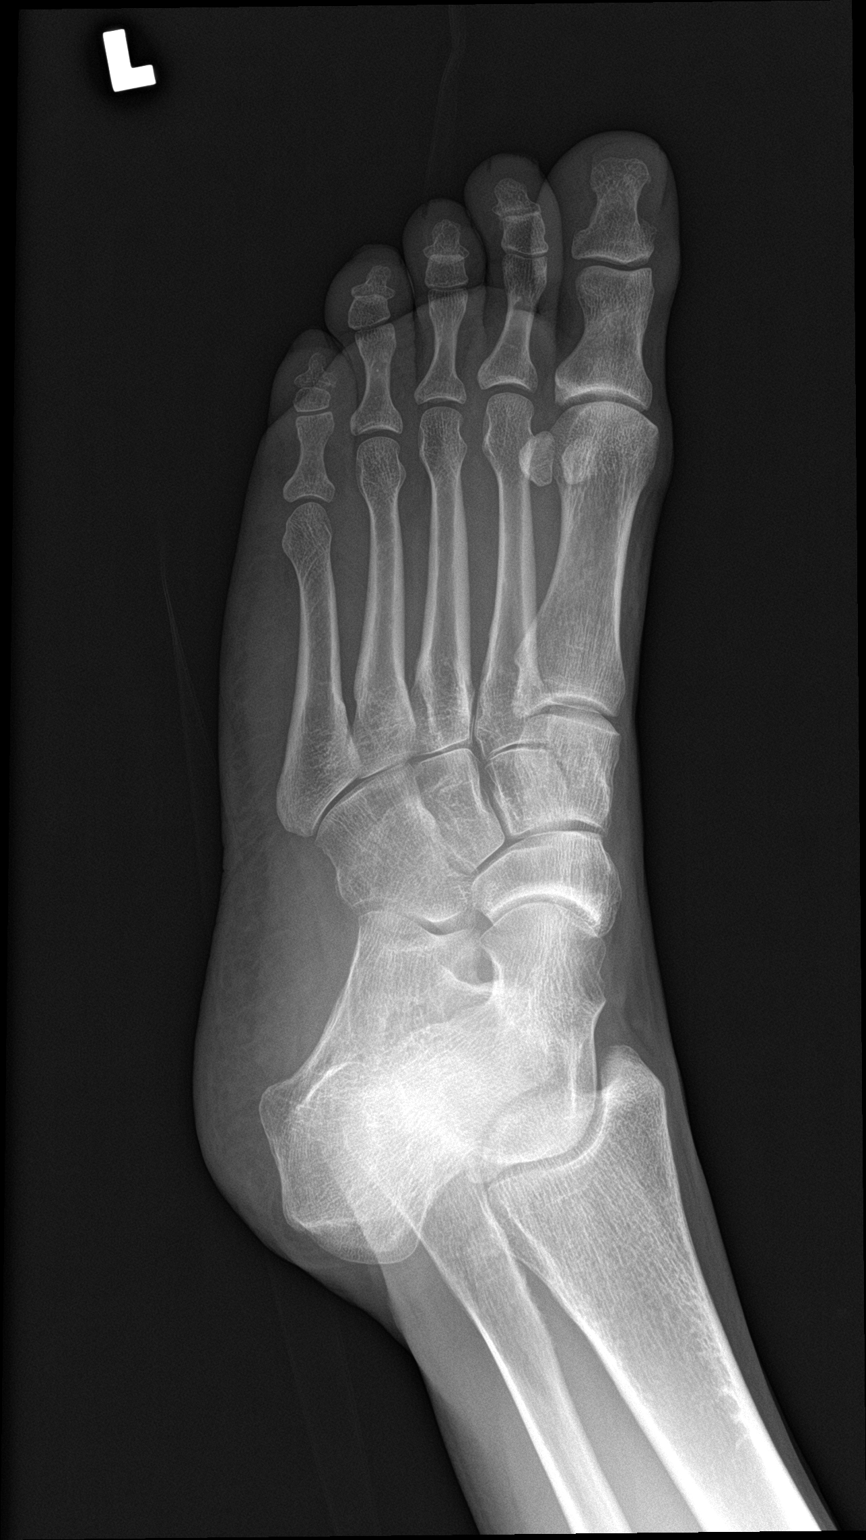

[foot lat]
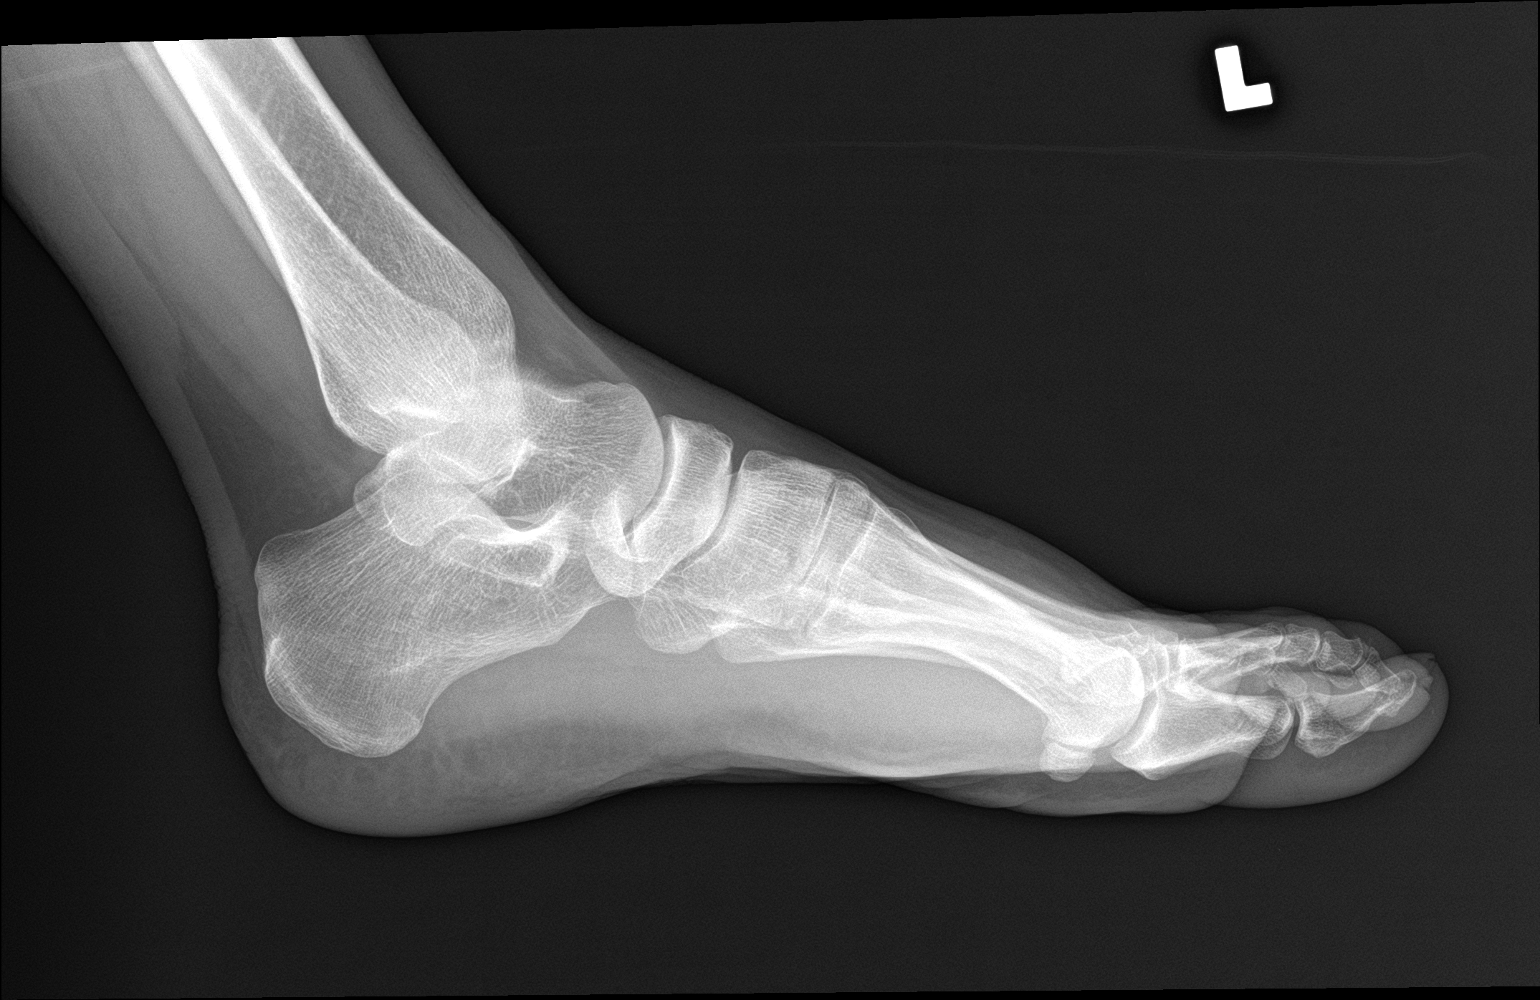

[3 of 3 positions shown; findings below may reference images not displayed]

FINDINGS: A very small area of cortical irregularity is seen involving distal
phalanx of the fifth left toe. There is no evidence of dislocation.
There is no evidence of arthropathy or other focal bone abnormality.
Soft tissues are unremarkable.
IMPRESSION: Subtle, nondisplaced fracture of the distal phalanx of the fifth
left toe. Correlation with physical examination is recommended to
determine the presence of point tenderness.

## 2023-02-13 ENCOUNTER — Other Ambulatory Visit (HOSPITAL_COMMUNITY): Payer: Self-pay

## 2023-02-13 ENCOUNTER — Other Ambulatory Visit (HOSPITAL_BASED_OUTPATIENT_CLINIC_OR_DEPARTMENT_OTHER): Payer: Self-pay

## 2023-02-13 MED ORDER — ACETAMINOPHEN 500 MG PO TABS
500.0000 mg | ORAL_TABLET | Freq: Four times a day (QID) | ORAL | 1 refills | Status: AC | PRN
  Filled 2023-02-13: qty 40, 10d supply, fill #0

## 2023-02-13 MED ORDER — IBUPROFEN 800 MG PO TABS
800.0000 mg | ORAL_TABLET | Freq: Four times a day (QID) | ORAL | 1 refills | Status: DC | PRN
  Filled 2023-02-13: qty 40, 10d supply, fill #0

## 2023-02-14 ENCOUNTER — Ambulatory Visit (INDEPENDENT_AMBULATORY_CARE_PROVIDER_SITE_OTHER): Payer: Medicare Other

## 2023-02-14 VITALS — Ht 59.0 in | Wt 139.0 lb

## 2023-02-14 DIAGNOSIS — Z0000 Encounter for general adult medical examination without abnormal findings: Secondary | ICD-10-CM

## 2023-02-14 DIAGNOSIS — Z1231 Encounter for screening mammogram for malignant neoplasm of breast: Secondary | ICD-10-CM

## 2023-02-14 DIAGNOSIS — Z122 Encounter for screening for malignant neoplasm of respiratory organs: Secondary | ICD-10-CM

## 2023-02-14 NOTE — Patient Instructions (Signed)
Gina Alexander , Thank you for taking time to come for your Medicare Wellness Visit. I appreciate your ongoing commitment to your health goals. Please review the following plan we discussed and let me know if I can assist you in the future.   Referrals/Orders/Follow-Ups/Clinician Recommendations: Aim for 30 minutes of exercise or brisk walking, 6-8 glasses of water, and 5 servings of fruits and vegetables each day.   This is a list of the screening recommended for you and due dates:  Health Maintenance  Topic Date Due   Colon Cancer Screening  Never done   Mammogram  03/24/2021   Zoster (Shingles) Vaccine (2 of 2) 06/19/2022   Flu Shot  01/09/2023   COVID-19 Vaccine (5 - 2023-24 season) 02/09/2023   Medicare Annual Wellness Visit  02/14/2024   Pap Smear  02/20/2025   DTaP/Tdap/Td vaccine (6 - Td or Tdap) 11/20/2031   Hepatitis C Screening  Completed   HIV Screening  Completed   HPV Vaccine  Aged Out    Advanced directives: (Provided) Advance directive discussed with you today. I have provided a copy for you to complete at home and have notarized. Once this is complete, please bring a copy in to our office so we can scan it into your chart. Information on Advanced Care Planning can be found at [facility] Advance Health Care Directives Advance Health Care Directives ([email or web address]    Next Medicare Annual Wellness Visit scheduled for next year: Yes insert Preventive Care Attachment Reference

## 2023-02-14 NOTE — Progress Notes (Signed)
Subjective:   Gina Alexander is a 52 y.o. female who presents for Medicare Annual (Subsequent) preventive examination.  Visit Complete: Virtual  I connected with  [name] on 02/14/23 by a audio enabled telemedicine application and verified that I am speaking with the correct person using two identifiers.  Patient Location: Home  Provider Location: Home Office  I discussed the limitations of evaluation and management by telemedicine. The patient expressed understanding and agreed to proceed.  Patient Medicare AWV questionnaire was completed by the patient on 11/17/2022; I have confirmed that all information answered by patient is correct and no changes since this date.  Review of Systems    Vital Signs: Unable to obtain new vitals due to this being a telehealth visit.  Cardiac Risk Factors include: advanced age (>104men, >54 women);smoking/ tobacco exposure;hypertension     Objective:    Today's Vitals   02/14/23 1533  Weight: 139 lb (63 kg)  Height: 4\' 11"  (1.499 m)   Body mass index is 28.07 kg/m.     02/14/2023    3:38 PM 12/12/2021    2:57 PM  Advanced Directives  Does Patient Have a Medical Advance Directive? No No  Would patient like information on creating a medical advance directive? Yes (MAU/Ambulatory/Procedural Areas - Information given) No - Patient declined    Current Medications (verified) Outpatient Encounter Medications as of 02/14/2023  Medication Sig   acetaminophen (GOODSENSE PAIN RELIEF EXTRA ST) 500 MG tablet Take 1 tablet (500 mg total) by mouth every 6 (six) hours as needed for 4 days,then as needed for pain   atorvastatin (LIPITOR) 80 MG tablet TAKE ONE TABLET BY MOUTH DAILY.   Dolutegravir-lamiVUDine (DOVATO) 50-300 MG TABS Take 1 tablet by mouth daily.   ibuprofen (ADVIL) 800 MG tablet Take 1 tablet (800 mg total) by mouth every 6 (six) hours as needed for 4 days, then take as needed for pain   losartan (COZAAR) 25 MG tablet Take 12.5 mg by  mouth daily.   metoprolol succinate (TOPROL-XL) 25 MG 24 hr tablet Take 25 mg by mouth daily.   nitrofurantoin, macrocrystal-monohydrate, (MACROBID) 100 MG capsule Take 1 capsule (100 mg total) by mouth 2 (two) times daily. 1 po BId   pantoprazole (PROTONIX) 40 MG tablet Take 1 tablet (40 mg total) by mouth 2 (two) times daily. For stomach   REMIFEMIN MENOPAUSE RELIEF 21 MG TABS USe daily as dircted on label   sennosides-docusate sodium (SENOKOT-S) 8.6-50 MG tablet Take 1 tablet by mouth daily.   No facility-administered encounter medications on file as of 02/14/2023.    Allergies (verified) Efavirenz, Sulfa antibiotics, Cymbalta [duloxetine hcl], Nelfinavir, and Duloxetine   History: Past Medical History:  Diagnosis Date   Depression    HIV infection (HCC)    Hyperlipidemia    Hypertension    Myocardial infarction [facility])    Past Surgical History:  Procedure Laterality Date   BREAST ENHANCEMENT SURGERY     CHOLECYSTECTOMY     TUBAL LIGATION  2006   uterine ablation     Family History  Problem Relation Age of Onset   Arthritis Mother    COPD Mother    Social History   Socioeconomic History   Marital status: Divorced    Spouse name: Not on file   Number of children: Not on file   Years of education: Not on file   Highest education level: Not on file  Occupational History   Not on file  Tobacco Use   Smoking  status: Every Day    Current packs/day: 0.50    Types: Cigarettes   Smokeless tobacco: Former    Quit date: 01/11/2014  Substance and Sexual Activity   Alcohol use: Yes    Alcohol/week: 2.0 standard drinks of alcohol    Types: 2 Standard drinks or equivalent per week   Drug use: No   Sexual activity: Not Currently  Other Topics Concern   Not on file  Social History Narrative   Not on file   Social Determinants of Health   Financial Resource Strain: Low Risk  (02/14/2023)   Overall Financial Resource Strain (CARDIA)    Difficulty of Paying Living Expenses:  Not hard at all  Food Insecurity: No Food Insecurity (02/14/2023)   Hunger Vital Sign    Worried About Running Out of Food in the Last Year: Never true    Ran Out of Food in the Last Year: Never true  Transportation Needs: No Transportation Needs (02/14/2023)   PRAPARE - Administrator, Civil Service (Medical): No    Lack of Transportation (Non-Medical): No  Physical Activity: Inactive (02/14/2023)   Exercise Vital Sign    Days of Exercise per Week: 0 days    Minutes of Exercise per Session: 0 min  Stress: No Stress Concern Present (02/14/2023)   [employer] of Occupational Health - Occupational Stress Questionnaire    Feeling of Stress : Not at all  Social Connections: Moderately Isolated (02/14/2023)   Social Connection and Isolation Panel [NHANES]    Frequency of Communication with Friends and Family: More than three times a week    Frequency of Social Gatherings with Friends and Family: More than three times a week    Attends Religious Services: More than 4 times per year    Active Member of [employer] or Organizations: No    Attends Engineer, structural: Never    Marital Status: Divorced    Tobacco Counseling Ready to quit: No Counseling given: Not Answered   Clinical Intake:  Pre-visit preparation completed: Yes  Pain : No/denies pain     Nutritional Risks: None Diabetes: No  How often do you need to have someone help you when you read instructions, pamphlets, or other written materials from your doctor or pharmacy?: 1 - Never  Interpreter Needed?: No  Information entered by :: Renie Ora, LPN   Activities of Daily Living    02/14/2023    3:39 PM  In your present state of health, do you have any difficulty performing the following activities:  Hearing? 0  Vision? 0  Difficulty concentrating or making decisions? 0  Walking or climbing stairs? 0  Dressing or bathing? 0  Doing errands, shopping? 0  Preparing Food and eating ? N  Using the  Toilet? N  In the past six months, have you accidently leaked urine? N  Do you have problems with loss of bowel control? N  Managing your Medications? N  Managing your Finances? N  Housekeeping or managing your Housekeeping? N    Patient Care Team: Mechele Claude, MD as PCP - General (Family Medicine)  Indicate any recent Medical Services you may have received from other than [facility] providers in the past year (date may be approximate).     Assessment:   This is a routine wellness examination for Gina Alexander.  Hearing/Vision screen Vision Screening - Comments:: Wears rx glasses - up to date with routine eye exams with  [facility]    Goals Addressed  This Visit's Progress    Exercise 3x per week (30 min per time)         Depression Screen    02/14/2023    3:36 PM 06/13/2022    9:52 AM 06/13/2022    9:45 AM 05/22/2022   10:47 AM 02/20/2022   10:05 AM 02/20/2022    9:50 AM 12/12/2021    2:50 PM  PHQ 2/9 Scores  PHQ - 2 Score 0 1 0 0 1 0 1  PHQ- 9 Score  5   6  1     Fall Risk    02/14/2023    3:34 PM 06/13/2022    9:45 AM 05/22/2022   10:47 AM 02/20/2022    9:50 AM 12/12/2021    2:59 PM  Fall Risk   Falls in the past year? 0 0 0 0 0  Number falls in past yr: 0    0  Injury with Fall? 0    0  Risk for fall due to : No Fall Risks    No Fall Risks  Follow up Falls prevention discussed    Falls prevention discussed    MEDICARE RISK AT HOME: Medicare Risk at Home Any stairs in or around the home?: Yes If so, are there any without handrails?: No Home free of loose throw rugs in walkways, pet beds, electrical cords, etc?: Yes Adequate lighting in your home to reduce risk of falls?: Yes Life alert?: No Use of a cane, walker or w/c?: No Grab bars in the bathroom?: Yes Shower chair or bench in shower?: Yes Elevated toilet seat or a handicapped toilet?: Yes  TIMED UP AND GO:  Was the test performed?  No    Cognitive Function:        02/14/2023    3:39 PM  12/12/2021    3:01 PM  6CIT Screen  What Year? 0 points 0 points  What month? 0 points 0 points  What time? 0 points 0 points  Count back from 20 0 points 0 points  Months in reverse 0 points 0 points  Repeat phrase 0 points 0 points  Total Score 0 points 0 points    Immunizations Immunization History  Administered Date(s) Administered   Influenza, Seasonal, Injecte, Preservative Fre 03/12/2012, 03/11/2016, 03/22/2017, 03/18/2018, 03/27/2019, 03/13/2020, 03/28/2021   Influenza,inj,Quad PF,6+ Mos 03/11/2016, 03/28/2021   Influenza-Unspecified 03/24/2013, 04/04/2014, 04/06/2015, 03/22/2017, 03/18/2018, 03/27/2019   Meningococcal Conjugate 12/20/2015   PFIZER Comirnaty(Gray Top)Covid-19 Tri-Sucrose Vaccine 06/29/2020   PFIZER(Purple Top)SARS-COV-2 Vaccination 06/13/2019, 07/04/2019, 06/29/2020   Pneumococcal Conjugate-13 01/23/2012, 02/18/2018   Pneumococcal Polysaccharide-23 11/29/2009, 02/16/2010   Td 01/25/2010   Td (Adult) 01/25/2010   Tdap 01/25/2010, 11/09/2010, 11/19/2021    TDAP status: Up to date  Flu Vaccine status: Due, Education has been provided regarding the importance of this vaccine. Advised may receive this vaccine at local pharmacy or Health Dept. Aware to provide a copy of the vaccination record if obtained from local pharmacy or Health Dept. Verbalized acceptance and understanding.  Pneumococcal vaccine status: Up to date  Covid-19 vaccine status: Completed vaccines  Qualifies for Shingles Vaccine? Yes   Zostavax completed No   Shingrix Completed?: No.    Education has been provided regarding the importance of this vaccine. Patient has been advised to call insurance company to determine out of pocket expense if they have not yet received this vaccine. Advised may also receive vaccine at local pharmacy or Health Dept. Verbalized acceptance and understanding.  Screening Tests Health Maintenance  Topic Date  Due   Colonoscopy  Never done   MAMMOGRAM  03/24/2021    Zoster Vaccines- Shingrix (2 of 2) 06/19/2022   INFLUENZA VACCINE  01/09/2023   COVID-19 Vaccine (5 - 2023-24 season) 02/09/2023   Medicare Annual Wellness (AWV)  02/14/2024   PAP SMEAR-Modifier  02/20/2025   DTaP/Tdap/Td (6 - Td or Tdap) 11/20/2031   Hepatitis C Screening  Completed   HIV Screening  Completed   HPV VACCINES  Aged Out    Health Maintenance  Health Maintenance Due  Topic Date Due   Colonoscopy  Never done   MAMMOGRAM  03/24/2021   Zoster Vaccines- Shingrix (2 of 2) 06/19/2022   INFLUENZA VACCINE  01/09/2023   COVID-19 Vaccine (5 - 2023-24 season) 02/09/2023    Colorectal cancer screening: Referral to GI placed has cologuard kit to complete . Pt aware the office will call re: appt.  Mammogram status: Ordered 02/14/2023. Pt provided with contact info and advised to call to schedule appt.   Bone Density status: Ordered not of age . Pt provided with contact info and advised to call to schedule appt.  Lung Cancer Screening: (Low Dose CT Chest recommended if Age 93-80 years, 20 pack-year currently smoking OR have quit w/in 15years.) does qualify.   Lung Cancer Screening Referral: 02/14/2023  Additional Screening:  Hepatitis C Screening: does not qualify; Completed 11/19/2021  Vision Screening: Recommended annual ophthalmology exams for early detection of glaucoma and other disorders of the eye. Is the patient up to date with their annual eye exam?  Yes  Who is the provider or what is the name of the office in which the patient attends annual eye exams? [facility]  If pt is not established with a provider, would they like to be referred to a provider to establish care? No .   Dental Screening: Recommended annual dental exams for proper oral hygiene   Community Resource Referral / Chronic Care Management: CRR required this visit?  No   CCM required this visit?  No     Plan:     I have personally reviewed and noted the following in the patient's  chart:   Medical and social history Use of alcohol, tobacco or illicit drugs  Current medications and supplements including opioid prescriptions. Patient is not currently taking opioid prescriptions. Functional ability and status Nutritional status Physical activity Advanced directives List of other physicians Hospitalizations, surgeries, and ER visits in previous 12 months Vitals Screenings to include cognitive, depression, and falls Referrals and appointments  In addition, I have reviewed and discussed with patient certain preventive protocols, quality metrics, and best practice recommendations. A written personalized care plan for preventive services as well as general preventive health recommendations were provided to patient.     Lorrene Reid, LPN   4/0/9811   After Visit Summary: (MyChart) Due to this being a telephonic visit, the after visit summary with patients personalized plan was offered to patient via MyChart   Nurse Notes: none

## 2023-02-25 ENCOUNTER — Other Ambulatory Visit (HOSPITAL_COMMUNITY): Payer: Self-pay

## 2023-03-12 DIAGNOSIS — I10 Essential (primary) hypertension: Secondary | ICD-10-CM | POA: Diagnosis not present

## 2023-03-12 DIAGNOSIS — I2510 Atherosclerotic heart disease of native coronary artery without angina pectoris: Secondary | ICD-10-CM | POA: Diagnosis not present

## 2023-03-12 DIAGNOSIS — F17200 Nicotine dependence, unspecified, uncomplicated: Secondary | ICD-10-CM | POA: Diagnosis not present

## 2023-03-12 DIAGNOSIS — E785 Hyperlipidemia, unspecified: Secondary | ICD-10-CM | POA: Diagnosis not present

## 2023-03-25 DIAGNOSIS — I10 Essential (primary) hypertension: Secondary | ICD-10-CM | POA: Diagnosis not present

## 2023-03-25 DIAGNOSIS — I2510 Atherosclerotic heart disease of native coronary artery without angina pectoris: Secondary | ICD-10-CM | POA: Diagnosis not present

## 2023-04-01 DIAGNOSIS — R400 Somnolence: Secondary | ICD-10-CM | POA: Diagnosis not present

## 2023-04-09 ENCOUNTER — Other Ambulatory Visit: Payer: Self-pay | Admitting: *Deleted

## 2023-04-09 DIAGNOSIS — F17210 Nicotine dependence, cigarettes, uncomplicated: Secondary | ICD-10-CM

## 2023-04-09 DIAGNOSIS — Z122 Encounter for screening for malignant neoplasm of respiratory organs: Secondary | ICD-10-CM

## 2023-04-09 DIAGNOSIS — Z87891 Personal history of nicotine dependence: Secondary | ICD-10-CM

## 2023-04-15 ENCOUNTER — Ambulatory Visit: Payer: Medicare Other | Admitting: Acute Care

## 2023-04-15 ENCOUNTER — Encounter: Payer: Self-pay | Admitting: Acute Care

## 2023-04-15 DIAGNOSIS — F17210 Nicotine dependence, cigarettes, uncomplicated: Secondary | ICD-10-CM

## 2023-04-15 NOTE — Progress Notes (Signed)
Virtual Visit via Telephone Note  I connected with Gina Alexander on 04/15/23 at  3:00 PM EST by telephone and verified that I am speaking with the correct person using two identifiers.  Location: Patient:  At home Provider:[address] [place], [place], [address]    I discussed the limitations, risks, security and privacy concerns of performing an evaluation and management service by telephone and the availability of in person appointments. I also discussed with the patient that there may be a patient responsible charge related to this service. The patient expressed understanding and agreed to proceed.   Shared Decision Making Visit Lung Cancer Screening Program [phone])   Eligibility: Age 52 y.o. Pack Years Smoking History Calculation 29 pack year smoking history (# packs/per year x # years smoked) Recent History of coughing up blood  no Unexplained weight loss? no ( >Than 15 pounds within the last 6 months ) Prior History Lung / other cancer no (Diagnosis within the last 5 years already requiring surveillance chest CT Scans). Smoking Status Current Smoker Former Smokers: Years since quit:  NA  Quit Date:  NA  Visit Components: Discussion included one or more decision making aids. yes Discussion included risk/benefits of screening. yes Discussion included potential follow up diagnostic testing for abnormal scans. yes Discussion included meaning and risk of over diagnosis. yes Discussion included meaning and risk of False Positives. yes Discussion included meaning of total radiation exposure. yes  Counseling Included: Importance of adherence to annual lung cancer LDCT screening. yes Impact of comorbidities on ability to participate in the program. yes Ability and willingness to under diagnostic treatment. yes  Smoking Cessation Counseling: Current Smokers:  Discussed importance of smoking cessation. yes Information about tobacco cessation classes and interventions  provided to patient. yes Patient provided with "ticket" for LDCT Scan. yes Symptomatic Patient. no  Counseling NA Diagnosis Code: Tobacco Use Z72.0 Asymptomatic Patient yes  Counseling (Intermediate counseling: > three minutes counseling) U0454 Former Smokers:  Discussed the importance of maintaining cigarette abstinence. yes Diagnosis Code: Personal History of Nicotine Dependence. U98.119 Information about tobacco cessation classes and interventions provided to patient. Yes Patient provided with "ticket" for LDCT Scan. yes Written Order for Lung Cancer Screening with LDCT placed in Epic. Yes (CT Chest Lung Cancer Screening Low Dose W/O CM) [id number] Z12.2-Screening of respiratory organs Z87.891-Personal history of nicotine dependence  Smoking cessation counseling x 3 minutes   Bevelyn Ngo, NP 04/15/2023

## 2023-04-15 NOTE — Patient Instructions (Signed)

## 2023-04-16 ENCOUNTER — Ambulatory Visit: Payer: Medicare Other

## 2023-04-16 DIAGNOSIS — F17210 Nicotine dependence, cigarettes, uncomplicated: Secondary | ICD-10-CM | POA: Diagnosis not present

## 2023-04-16 DIAGNOSIS — Z87891 Personal history of nicotine dependence: Secondary | ICD-10-CM

## 2023-04-16 DIAGNOSIS — Z122 Encounter for screening for malignant neoplasm of respiratory organs: Secondary | ICD-10-CM | POA: Diagnosis not present

## 2023-05-14 ENCOUNTER — Telehealth: Payer: Self-pay | Admitting: Acute Care

## 2023-05-14 NOTE — Telephone Encounter (Signed)
Returned call. Advised results just came back and need to be reviewed by provider. Advised staff will call her back once reviewed.

## 2023-05-14 NOTE — Telephone Encounter (Signed)
CT scan from November 6th

## 2023-05-16 ENCOUNTER — Telehealth: Payer: Self-pay | Admitting: Acute Care

## 2023-05-16 NOTE — Telephone Encounter (Signed)
I have attempted to call the patient with the results of their  Low Dose CT Chest Lung cancer screening scan. There was no answer. I have left a HIPPA compliant VM requesting the patient call the office for the scan results. I included the office contact information in the message. We will await his return call. If no return call we will continue to call until patient is contacted.    Ok for you all to call. She needs a 3 month follow up. She has age advanced CAD, but she is on a statin and her PCP seems to be treating this. Follow up scan is due 07/2023. Please fax results to PCP. Thanks so much

## 2023-05-19 NOTE — Telephone Encounter (Signed)
Patient called back and left VM. I called the number pt left in her VM and had to leave a VM to return our call.

## 2023-05-21 ENCOUNTER — Telehealth: Payer: Self-pay | Admitting: Acute Care

## 2023-05-21 NOTE — Telephone Encounter (Signed)
I have attempted to call the patient with the results of their  Low Dose CT Chest Lung cancer screening scan. There was no answer. I have left a HIPPA compliant VM requesting the patient call the office for the scan results. I included the office contact information in the message. We will await his return call. If no return call we will continue to call until patient is contacted.    She will need a 3 month follow up scan. Thanks so much

## 2023-05-23 ENCOUNTER — Telehealth: Payer: Self-pay | Admitting: Acute Care

## 2023-05-23 NOTE — Telephone Encounter (Signed)
I have attempted to call the patient with the results of their  Low Dose CT Chest Lung cancer screening scan. There was no answer. I have left a HIPPA compliant VM requesting the patient call the office for the scan results. I included the office contact information in the message. We will await his return call. If no return call we will continue to call until patient is contacted.    I have tried to call the patient x 2 with no answer. Today I called both her home number and her cell. I left messages on both VM.  Lets go ahead and send her a letter as we have been unable to get in touch with her and she has not responded to our voicemails.  Scan was read as a 4A she has an 8.1 mm left lower lobe nodule, as well as a 7.7 mm medial left lower lobe nodule.  She needs a 82-month follow-up which will be due the beginning of February 2024 This scan was done 04/16/2023, and read 05/14/2023.  There was also notation of age advanced coronary artery calcifications specifically involvement of the left anterior descending and right coronary arteries.  Additionally the heart is in the upper limits of normal in size therefore mildly enlarged . We need to discover if she has family history of coronary artery disease.  She is on statin therapy per her PCP.  She may need referral to cardiology if her PCP feels that is clinically indicated.  Please fax results to PCP and let him know plan will be for 29-month follow-up. Thank so much  Pulmonary nodules measure up to 8.1 mm in the left lower lobe. Lung-RADS 4A, suspicious. Follow up low-dose chest CT without contrast in 3 months (please use the following order, "CT CHEST LCS NODULE FOLLOW-UP W/O CM") is recommended. Alternatively, PET may be considered when there is a solid component 8mm or larger. These results will be called to the ordering clinician or representative by the Radiologist Assistant, and communication documented in the PACS or [employer]. 2. Age  advanced coronary artery calcification. 3.  Aortic atherosclerosis (ICD10-I70.0). 4.  Emphysema (ICD10-J43.9).

## 2023-05-26 NOTE — Telephone Encounter (Signed)
Letter sent. Results and plan faxed to PCP.

## 2023-07-15 ENCOUNTER — Encounter: Payer: Self-pay | Admitting: Family Medicine

## 2023-07-15 MED ORDER — QUETIAPINE FUMARATE 25 MG PO TABS: 25.0000 mg | ORAL_TABLET | Freq: Every day | ORAL | 0 refills | Status: DC

## 2023-10-06 ENCOUNTER — Encounter: Payer: Self-pay | Admitting: Family Medicine

## 2023-10-06 ENCOUNTER — Other Ambulatory Visit: Payer: Self-pay | Admitting: Family Medicine

## 2023-10-06 NOTE — Telephone Encounter (Signed)
 LMTCB to schedule appt Letter mailed

## 2023-10-06 NOTE — Telephone Encounter (Signed)
 Stacks NTBS last OV 06/13/22 NO RF sent to pharmacy last OV greater than a year

## 2024-01-16 NOTE — Nursing Note (Signed)
 Patient ambulated in the hall, right groin dressing remained c/d/i without hematoma.

## 2024-01-22 ENCOUNTER — Ambulatory Visit: Payer: Self-pay | Admitting: Family Medicine

## 2024-01-22 ENCOUNTER — Ambulatory Visit (INDEPENDENT_AMBULATORY_CARE_PROVIDER_SITE_OTHER)

## 2024-01-22 VITALS — BP 141/82 | HR 56 | Temp 97.5°F | Ht 59.0 in | Wt 138.0 lb

## 2024-01-22 DIAGNOSIS — I10 Essential (primary) hypertension: Secondary | ICD-10-CM | POA: Diagnosis not present

## 2024-01-22 DIAGNOSIS — M79672 Pain in left foot: Secondary | ICD-10-CM

## 2024-01-22 DIAGNOSIS — E782 Mixed hyperlipidemia: Secondary | ICD-10-CM

## 2024-01-22 DIAGNOSIS — E119 Type 2 diabetes mellitus without complications: Secondary | ICD-10-CM | POA: Diagnosis not present

## 2024-01-22 DIAGNOSIS — M79671 Pain in right foot: Secondary | ICD-10-CM

## 2024-01-22 DIAGNOSIS — F3341 Major depressive disorder, recurrent, in partial remission: Secondary | ICD-10-CM

## 2024-01-22 LAB — BAYER DCA HB A1C WAIVED: HB A1C (BAYER DCA - WAIVED): 6.3 % — ABNORMAL HIGH (ref 4.8–5.6)

## 2024-01-22 MED ORDER — PREDNISONE 10 MG PO TABS
ORAL_TABLET | ORAL | 0 refills | Status: DC
Start: 2024-01-22 — End: 2024-03-10

## 2024-01-22 MED ORDER — VARENICLINE TARTRATE 1 MG PO TABS: 1.0000 mg | ORAL_TABLET | Freq: Two times a day (BID) | ORAL | 5 refills | Status: AC

## 2024-01-22 MED ORDER — ESCITALOPRAM OXALATE 10 MG PO TABS: 10.0000 mg | ORAL_TABLET | Freq: Every day | ORAL | 1 refills | Status: DC

## 2024-01-22 MED ORDER — TIRZEPATIDE 2.5 MG/0.5ML ~~LOC~~ SOAJ: 2.5000 mg | SUBCUTANEOUS | 2 refills | Status: DC

## 2024-01-22 MED ORDER — ONDANSETRON 8 MG PO TBDP: 8.0000 mg | ORAL_TABLET | Freq: Four times a day (QID) | ORAL | 1 refills | Status: AC | PRN

## 2024-01-22 MED ORDER — VARENICLINE TARTRATE (STARTER) 0.5 MG X 11 & 1 MG X 42 PO TBPK
ORAL_TABLET | ORAL | 0 refills | Status: DC
Start: 2024-01-22 — End: 2024-04-27

## 2024-01-23 LAB — LIPID PANEL
Chol/HDL Ratio: 4.7 ratio — ABNORMAL HIGH (ref 0.0–4.4)
Cholesterol, Total: 123 mg/dL (ref 100–199)
HDL: 26 mg/dL — ABNORMAL LOW (ref >39)
LDL Chol Calc (NIH): 53 mg/dL (ref 0–99)
Triglycerides: 282 mg/dL — ABNORMAL HIGH (ref 0–149)
VLDL Cholesterol Cal: 44 mg/dL — ABNORMAL HIGH (ref 5–40)

## 2024-01-23 LAB — CMP14+EGFR
ALT: 19 IU/L (ref 0–32)
AST: 21 IU/L (ref 0–40)
Albumin: 4.4 g/dL (ref 3.8–4.9)
Alkaline Phosphatase: 97 IU/L (ref 44–121)
BUN/Creatinine Ratio: 10 (ref 9–23)
BUN: 9 mg/dL (ref 6–24)
Bilirubin Total: 0.5 mg/dL (ref 0.0–1.2)
CO2: 23 mmol/L (ref 20–29)
Calcium: 10.1 mg/dL (ref 8.7–10.2)
Chloride: 100 mmol/L (ref 96–106)
Creatinine, Ser: 0.94 mg/dL (ref 0.57–1.00)
Globulin, Total: 2.8 g/dL (ref 1.5–4.5)
Glucose: 100 mg/dL — ABNORMAL HIGH (ref 70–99)
Potassium: 4 mmol/L (ref 3.5–5.2)
Sodium: 138 mmol/L (ref 134–144)
Total Protein: 7.2 g/dL (ref 6.0–8.5)
eGFR: 73 mL/min/1.73 (ref >59)

## 2024-01-23 LAB — CBC WITH DIFFERENTIAL/PLATELET
Basophils Absolute: 0.1 x10E3/uL (ref 0.0–0.2)
Basos: 1 %
EOS (ABSOLUTE): 0.4 x10E3/uL (ref 0.0–0.4)
Eos: 3 %
Hematocrit: 44.8 % (ref 34.0–46.6)
Hemoglobin: 15.2 g/dL (ref 11.1–15.9)
Immature Grans (Abs): 0 x10E3/uL (ref 0.0–0.1)
Immature Granulocytes: 0 %
Lymphocytes Absolute: 3.6 x10E3/uL — ABNORMAL HIGH (ref 0.7–3.1)
Lymphs: 31 %
MCH: 31.1 pg (ref 26.6–33.0)
MCHC: 33.9 g/dL (ref 31.5–35.7)
MCV: 92 fL (ref 79–97)
Monocytes Absolute: 0.7 x10E3/uL (ref 0.1–0.9)
Monocytes: 6 %
Neutrophils Absolute: 6.7 x10E3/uL (ref 1.4–7.0)
Neutrophils: 59 %
Platelets: 273 x10E3/uL (ref 150–450)
RBC: 4.88 x10E6/uL (ref 3.77–5.28)
RDW: 13.4 % (ref 11.7–15.4)
WBC: 11.5 x10E3/uL — ABNORMAL HIGH (ref 3.4–10.8)

## 2024-01-25 ENCOUNTER — Ambulatory Visit: Payer: Self-pay | Admitting: Family Medicine

## 2024-01-25 ENCOUNTER — Encounter: Payer: Self-pay | Admitting: Family Medicine

## 2024-01-25 NOTE — Progress Notes (Signed)
 Subjective:  Patient ID: Gina Alexander, female    DOB: 1971/02/07  Age: 53 y.o. MRN: [id number]  CC: DISCUSS DIABETES; DISCUSS LUNG SCAN RESULTS; HEEL PAIN, BOTH HEELS; and RECENT HEART ATTACK (01/16/2024)   HPI  Discussed the use of AI scribe software for clinical note transcription with the patient, who gave verbal consent to proceed.  History of Present Illness   Gina Alexander is a 53 year old female with diabetes and heart disease who presents for follow-up on her A1c and foot pain.  She is here for a follow-up on her diabetes management. Her last A1c was 6.7% five and a half months ago. She has not been checking her blood sugar at home regularly. She recently obtained insurance in August, which has allowed her to catch up on her medical care.  She reports bilateral heel pain for about five months, describing it as feeling like 'walking on a bed of needles,' which worsens as the day progresses. The pain is present regardless of the type of shoes worn or activity level.  She has a history of lung nodules, which were identified in a previous lung scan. She was unable to follow up on these due to lack of insurance. 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 She has paper records of her past lung scans but not the actual films.  She is experiencing significant depression, exacerbated by recent personal events, including the loss of her mother in April, family conflicts, and her daughter's recent move to [place]. She reports high levels of stress and emotional distress, particularly when discussing her daughter Gina Alexander.  Her current medications include heart medications and HIV medications. She is not currently taking Seroquel , which she had been on previously, and she reports taking Tylenol  twice a day. She recently had a heart catheterization, which showed no blockages, and her EKG was normal.  She has a history of two heart attacks, one at age  58 and another in 2023. She reports symptoms of nausea, shortness of breath, coughing, increased flushness, memory loss, and difficulty concentrating, which she initially attributed to cardiac issues.             01/22/2024    2:03 PM 02/14/2023    3:36 PM 06/13/2022    9:52 AM  Depression screen PHQ 2/9  Decreased Interest 3 0 0  Down, Depressed, Hopeless 3 0 1  PHQ - 2 Score 6 0 1  Altered sleeping 0  0  Tired, decreased energy 2  0  Change in appetite 2  2  Feeling bad or failure about yourself  3  2  Trouble concentrating 0  0  Moving slowly or fidgety/restless 1  0  Suicidal thoughts 0  0  PHQ-9 Score 14  5  Difficult doing work/chores Somewhat difficult  Not difficult at all    History Gina Alexander has a past medical history of Depression, HIV infection (HCC), Hyperlipidemia, Hypertension, and Myocardial infarction (HCC).   She has a past surgical history that includes Cholecystectomy; Tubal ligation (2006); uterine ablation; and Breast enhancement surgery.   Her family history includes Arthritis in her mother; COPD in her mother.She reports that she has been smoking cigarettes. She quit smokeless tobacco use about 10 years ago. She reports current alcohol use of about 2.0 standard drinks of alcohol per week. She reports that she does not use drugs.    ROS Review of Systems  Constitutional: Negative.   HENT: Negative.  Eyes:  Negative for visual disturbance.  Respiratory:  Negative for shortness of breath.   Cardiovascular:  Negative for chest pain.  Gastrointestinal:  Negative for abdominal pain.  Musculoskeletal:  Negative for arthralgias.    Objective:  BP (!) 141/82   Pulse (!) 56   Temp (!) 97.5 F (36.4 C)   Ht 4' 11 (1.499 m)   Wt 138 lb (62.6 kg)   SpO2 98%   BMI 27.87 kg/m   BP Readings from Last 3 Encounters:  01/22/24 (!) 141/82  06/13/22 (!) 144/78  05/22/22 138/82    Wt Readings from Last 3 Encounters:  01/22/24 138 lb (62.6 kg)  04/16/23  139 lb (63 kg)  02/14/23 139 lb (63 kg)     Physical Exam Constitutional:      General: She is not in acute distress.    Appearance: She is well-developed.  Cardiovascular:     Rate and Rhythm: Normal rate and regular rhythm.  Pulmonary:     Breath sounds: Normal breath sounds.  Musculoskeletal:        General: Normal range of motion.  Skin:    General: Skin is warm and dry.  Neurological:     Mental Status: She is alert and oriented to person, place, and time.      Assessment & Plan:  Essential hypertension -     Lipid panel  Mixed hyperlipidemia -     CBC with Differential/Platelet -     CMP14+EGFR  Type 2 diabetes mellitus without complication, without long-term current use of insulin (HCC) -     Bayer DCA Hb A1c Waived  Heel pain, bilateral -     DG Foot Complete Left; Future -     DG Foot Complete Right; Future -     Ambulatory referral to Podiatry  Recurrent major depressive disorder, in partial remission (HCC)  Other orders -     Tirzepatide ; Inject 2.5 mg into the skin once a week.  Dispense: 2 mL; Refill: 2 -     Escitalopram  Oxalate; Take 1 tablet (10 mg total) by mouth daily.  Dispense: 90 tablet; Refill: 1 -     Ondansetron ; Take 1 tablet (8 mg total) by mouth every 6 (six) hours as needed for nausea or vomiting.  Dispense: 20 tablet; Refill: 1 -     Varenicline  Tartrate (Starter); Use according to package directions  Dispense: 53 each; Refill: 0 -     Varenicline  Tartrate; Take 1 tablet (1 mg total) by mouth 2 (two) times daily.  Dispense: 60 tablet; Refill: 5 -     predniSONE ; Take 5 daily for 3 days followed by 4,3,2 and 1 for 3 days each.  Dispense: 45 tablet; Refill: 0    Assessment and Plan    Type 2 diabetes mellitus A1c improved to 6.3 but remains concerning. Eligible for diabetes management medications. - Order A1c test. - Prescribe Mounjaro , monitor for nausea, constipation, diarrhea. - Consider ondansetron  for nausea.  Major depressive  disorder Depression worsened by stress and bereavement. Lexapro  chosen due to previous nausea with duloxetine . - Prescribe Lexapro . - Advise starting Chantix  after two weeks of Lexapro .  Bilateral plantar fasciitis Persistent heel pain consistent with plantar fasciitis, no heel spurs. - Order x-ray of feet. - Refer to Dr. Roddie, podiatrist. - Recommend arch support. - Prescribe two-week course of prednisone .  Pulmonary nodules, history of History of nodules likely related to past tuberculosis. Follow-up needed to assess changes. - Order follow-up lung scan.  Nicotine dependence Desire to quit smoking. Chantix  planned post-Lexapro  initiation. - Advise starting Chantix  after two weeks of Lexapro .  Menopausal symptoms Experiencing hot flashes and memory loss. Hormone therapy contraindicated.  History of myocardial infarction Two past myocardial infarctions. Recent catheterization showed no blockages.          Follow-up: Return in about 1 month (around 02/22/2024).  Butler Der, M.D.

## 2024-01-27 ENCOUNTER — Other Ambulatory Visit (HOSPITAL_COMMUNITY): Payer: Self-pay

## 2024-01-27 ENCOUNTER — Telehealth: Payer: Self-pay

## 2024-01-27 NOTE — Telephone Encounter (Signed)
 Pharmacy Patient Advocate Encounter   Received notification from CoverMyMeds that prior authorization for Mounjaro  2.5MG /0.5ML is required/requested.   Insurance verification completed.   The patient is insured through [facility] .   Per test claim: PA required; PA submitted to above mentioned insurance via Phone Key/confirmation #/EOC [id number] Status is pending   -waiting on fax form from insurance

## 2024-01-29 ENCOUNTER — Other Ambulatory Visit (HOSPITAL_COMMUNITY): Payer: Self-pay

## 2024-01-29 NOTE — Telephone Encounter (Signed)
 Pharmacy Patient Advocate Encounter  Received notification from [employer] that Prior Authorization for  Mounjaro  2.5MG /0.5ML  has been APPROVED from 01/29/24 to [facility]    PA #/Case ID/Reference #: [id number]

## 2024-01-29 NOTE — Telephone Encounter (Signed)
-  faxed form back to the insurance at [phone]

## 2024-03-01 ENCOUNTER — Ambulatory Visit: Admitting: Family Medicine

## 2024-03-01 ENCOUNTER — Telehealth: Payer: Self-pay | Admitting: *Deleted

## 2024-03-01 NOTE — Telephone Encounter (Signed)
 Patient reports she takes her Mounjaro  injection on Saturdays and has no appetite until Thursday. Has to force feed herself and she can't eat but very little. Do you want her to continue or change her to something else?

## 2024-03-01 NOTE — Telephone Encounter (Signed)
 Can we work her in laterthis week?

## 2024-03-01 NOTE — Telephone Encounter (Signed)
 Patient is content with October 1st appointment. Will contact office if condition worsens.

## 2024-03-10 ENCOUNTER — Encounter: Payer: Self-pay | Admitting: Family Medicine

## 2024-03-10 ENCOUNTER — Ambulatory Visit: Admitting: Family Medicine

## 2024-03-10 VITALS — BP 116/75 | HR 60 | Temp 98.3°F | Ht 59.0 in | Wt 128.0 lb

## 2024-03-10 DIAGNOSIS — Z23 Encounter for immunization: Secondary | ICD-10-CM

## 2024-03-10 DIAGNOSIS — E119 Type 2 diabetes mellitus without complications: Secondary | ICD-10-CM

## 2024-03-10 DIAGNOSIS — I10 Essential (primary) hypertension: Secondary | ICD-10-CM

## 2024-03-10 DIAGNOSIS — F331 Major depressive disorder, recurrent, moderate: Secondary | ICD-10-CM | POA: Diagnosis not present

## 2024-03-10 DIAGNOSIS — Z7985 Long-term (current) use of injectable non-insulin antidiabetic drugs: Secondary | ICD-10-CM

## 2024-03-10 DIAGNOSIS — E782 Mixed hyperlipidemia: Secondary | ICD-10-CM | POA: Diagnosis not present

## 2024-03-10 DIAGNOSIS — Z21 Asymptomatic human immunodeficiency virus [HIV] infection status: Secondary | ICD-10-CM | POA: Diagnosis not present

## 2024-03-10 MED ORDER — QUETIAPINE FUMARATE 25 MG PO TABS: 25.0000 mg | ORAL_TABLET | Freq: Every day | ORAL | 1 refills | Status: AC

## 2024-03-10 MED ORDER — ESCITALOPRAM OXALATE 20 MG PO TABS: 20.0000 mg | ORAL_TABLET | Freq: Every day | ORAL | 1 refills | Status: AC

## 2024-03-10 NOTE — Progress Notes (Signed)
 Subjective:  Patient ID: Gina Alexander, female    DOB: January 11, 1971  Age: 53 y.o. MRN: [id number]  CC: Medical Management of Chronic Issues (1 month follow med check/Patient prefers seroquel  to zoloft /Mounjaro  is messing with patients appetite)   HPI  Discussed the use of AI scribe software for clinical note transcription with the patient, who gave verbal consent to proceed.  History of Present Illness Gina Alexander is a 53 year old female with HIV who presents with severe appetite loss and weight loss after starting Mounjaro .  She has experienced severe appetite loss and inability to eat since starting Mounjaro  on January 22, 2024. She takes the medication on Saturday nights and experiences an inability to eat from "Sunday morning until Thursday night. Attempts to eat result in discomfort, with solid food intake being nearly impossible. Her diet has been limited to a glass of chocolate milk and a soda daily, with no solid food intake for several days at a time.  She has lost ten pounds since starting the medication and is concerned about the rapid weight loss and inability to eat. She reports that her recent blood work from Dr. Barroso showed a detectable HIV viral load, elevated triglycerides, and changes in her red and white blood cell counts. She has not missed any doses of her HIV medication.  She is currently taking Lexapro for depression, which initially caused nausea but is now tolerated. She also takes Seroquel at a low dose for anxiety. She reports a high PHQ-9 score indicating significant depression and is unsure if her lack of appetite is related to Mounjaro or her depression. She has not started Chantix yet due to concerns about medication interactions.  Her social history includes limited social interaction, as she only goes to work and home, declining social invitations due to lack of desire. She drinks one soda a day and a glass of chocolate milk in the morning. She wears a  necklace containing her mother's ashes.          03/10/2024    3:32 PM 01/22/2024    2:03 PM 02/14/2023    3:36 PM  Depression screen PHQ 2/9  Decreased Interest 3 3 0  Down, Depressed, Hopeless 2 3 0  PHQ - 2 Score 5 6 0  Altered sleeping 2 0   Tired, decreased energy 1 2   Change in appetite 3 2   Feeling bad or failure about yourself  1 3   Trouble concentrating 1 0   Moving slowly or fidgety/restless 0 1   Suicidal thoughts 0 0   PHQ-9 Score 13 14   Difficult doing work/chores Somewhat difficult Somewhat difficult       10" /06/2023    3:33 PM 01/22/2024    2:03 PM 06/13/2022    9:53 AM 02/20/2022   10:06 AM  GAD 7 : Generalized Anxiety Score  Nervous, Anxious, on Edge 1 3 2 2   Control/stop worrying 1 3 2 2   Worry too much - different things 1 3 1 3   Trouble relaxing 1 2 0 2  Restless 0 2 0 1  Easily annoyed or irritable 1 2 2 2   Afraid - awful might happen 0 0 1 2  Total GAD 7 Score 5 15 8 14   Anxiety Difficulty  Somewhat difficult Somewhat difficult Somewhat difficult     History Shayne has a past medical history of Anemia, Anxiety, Arthritis, Depression, HIV infection (HCC), Hyperlipidemia, Hypertension, and Myocardial infarction (HCC).   She has a past  surgical history that includes Cholecystectomy; Tubal ligation (06/10/2004); uterine ablation; Breast enhancement surgery; Fracture surgery; Cosmetic surgery; and Breast surgery.   Her family history includes Arthritis in her mother; COPD in her brother and mother; Depression in her brother and mother; Drug abuse in her brother and mother; Hearing loss in her brother and mother; Varicose Veins in her mother; Vision loss in her brother and mother.She reports that she has been smoking cigarettes. She quit smokeless tobacco use about 10 years ago. She reports current alcohol use of about 2.0 standard drinks of alcohol per week. She reports that she does not use drugs.    ROS Review of Systems  Constitutional: Negative.    HENT: Negative.    Eyes:  Negative for visual disturbance.  Respiratory:  Negative for shortness of breath.   Cardiovascular:  Negative for chest pain.  Gastrointestinal:  Negative for abdominal pain.  Musculoskeletal:  Negative for arthralgias.    Objective:  BP 116/75   Pulse 60   Temp 98.3 F (36.8 C)   Ht 4' 11 (1.499 m)   Wt 128 lb (58.1 kg)   SpO2 96%   BMI 25.85 kg/m   BP Readings from Last 3 Encounters:  03/10/24 116/75  01/22/24 (!) 141/82  06/13/22 (!) 144/78    Wt Readings from Last 3 Encounters:  03/10/24 128 lb (58.1 kg)  01/22/24 138 lb (62.6 kg)  04/16/23 139 lb (63 kg)     Physical Exam Physical Exam GENERAL: Alert, cooperative, well developed, no acute distress HEENT: Normocephalic, normal oropharynx, moist mucous membranes CHEST: Clear to auscultation bilaterally, No wheezes, rhonchi, or crackles CARDIOVASCULAR: Normal heart rate and rhythm, S1 and S2 normal without murmurs ABDOMEN: Soft, non-tender, non-distended, without organomegaly, Normal bowel sounds EXTREMITIES: No cyanosis or edema NEUROLOGICAL: Cranial nerves grossly intact, Moves all extremities without gross motor or sensory deficit   Assessment & Plan:  Asymptomatic HIV infection (HCC)  Moderate episode of recurrent major depressive disorder (HCC)  Essential hypertension  Mixed hyperlipidemia  Type 2 diabetes mellitus without complication, without long-term current use of insulin (HCC)  Other orders -     QUEtiapine  Fumarate; Take 1 tablet (25 mg total) by mouth at bedtime. For anxiety  Dispense: 90 tablet; Refill: 1 -     Escitalopram  Oxalate; Take 1 tablet (20 mg total) by mouth daily.  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 Symptoms include  nausea and food aversion, with only liquids tolerated. The condition has persisted since starting the medication in mid-August, indicating it is unlikely to improve with continued use. Discontinue Tirzepatide  immediately. Consider a trial of Trulicity, pending insurance approval. Advise against active weight loss efforts until reassessment in six weeks. Encourage good nutrition and hydration, avoiding sweets, starches, and sodas.  HIV infection with detectable viral load   A detectable viral load of 33 copies/mL is concerning given the previous undetectable status. The CD4 count remains high at 1200, indicating no immediate risk of opportunistic infections. The change in viral load may relate to malnutrition and stress on the body. Continue current antiretroviral therapy without changes. Advise use of condoms if sexually active until the viral load becomes undetectable.  Depression and anxiety   Recurrent major depressive disorder with a high PHQ-9 score indicates significant depression. Anxiety symptoms are present but less  severe. Current treatment with Escitalopram  (Lexapro ) at a low dose is insufficient. She is hesitant about medication changes due to past side effects but is willing to continue treatment. Continue Escitalopram  and increase the dose to better manage depression. Prescribe low-dose Seroquel  (quetiapine ) for anxiety management. Encourage counseling and consider referral to a mental health professional for additional support.  Menopausal symptoms   Menopausal symptoms contribute to decreased libido and overall well-being. Symptoms may be exacerbated by depression and anxiety. Discuss the impact of menopause on symptoms and consider lifestyle modifications to improve quality of life.       Follow-up: Return in about 6 weeks (around 04/21/2024).  Butler Der, M.D.

## 2024-03-12 ENCOUNTER — Other Ambulatory Visit: Payer: Self-pay | Admitting: Family Medicine

## 2024-03-24 ENCOUNTER — Telehealth: Payer: Self-pay

## 2024-03-24 NOTE — Telephone Encounter (Signed)
 Copied from CRM [id number]. Topic: General - Other >> Mar 24, 2024  2:48 PM [name] wrote: Reason for CRM: Patient has medicare.

## 2024-03-25 ENCOUNTER — Encounter

## 2024-03-25 NOTE — Progress Notes (Signed)
 This encounter was created in error - please disregard (reschedule).

## 2024-03-25 NOTE — Telephone Encounter (Signed)
 Looks like patient has Medicare part A only which only helps cover for hospital.

## 2024-04-01 ENCOUNTER — Ambulatory Visit

## 2024-04-22 ENCOUNTER — Ambulatory Visit: Payer: Self-pay | Admitting: Family Medicine

## 2024-04-22 ENCOUNTER — Ambulatory Visit: Payer: Self-pay

## 2024-04-22 NOTE — Telephone Encounter (Signed)
Noted patient went to ED  

## 2024-04-22 NOTE — Telephone Encounter (Signed)
 Noted

## 2024-04-22 NOTE — Telephone Encounter (Signed)
 FYI Only or Action Required?: FYI only for provider: ED advised.  Patient was last seen in primary care on 03/10/2024 by Gina Lowers, MD.  Called Nurse Triage reporting Dizziness.  Symptoms began today.  Symptoms are: unchanged.  Triage Disposition: Call EMS 911 Now  Patient/caregiver understands and will follow disposition?: Yes      Copied from CRM #[id number]. Topic: Clinical - Red Word Triage >> Apr 22, 2024 11:39 AM Roselie BROCKS wrote: [facility] that prompted transfer to Nurse Triage: patients husband (blake) is on the line, and patient is with him, [name] states the patient heart rate is dropping a lot, patient is dizzy, almost fainting,      Reason for Disposition  Heart beating < 50 beats per minute OR > 140 beats per minute  Answer Assessment - Initial Assessment Questions 1. DESCRIPTION: Describe your dizziness.     Lightheaded  2. LIGHTHEADED: Do you feel lightheaded? (e.g., somewhat faint, woozy, weak upon standing)     Yes 3. VERTIGO: Do you feel like either you or the room is spinning or tilting? (i.e., vertigo)     No 4. SEVERITY: How bad is it?  Do you feel like you are going to faint? Can you stand and walk?     Moderate  5. ONSET:  When did the dizziness begin?     Today  7. HEART RATE: Can you tell me your heart rate? How many beats in 15 seconds?  (Note: Not all patients can do this.)       46 bpm at the time of the dizziness  8. CAUSE: What do you think is causing the dizziness? (e.g., decreased fluids or food, diarrhea, emotional distress, heat exposure, new medicine, sudden standing, vomiting; unknown)     Unsure  9. RECURRENT SYMPTOM: Have you had dizziness before? If Yes, ask: When was the last time? What happened that time?     Not quite like this  10. OTHER SYMPTOMS: Do you have any other symptoms? (e.g., fever, chest pain, vomiting, diarrhea, bleeding)       Slow heart rate, cough, congestion  Protocols used:  Dizziness - Lightheadedness-A-AH

## 2024-04-22 NOTE — Telephone Encounter (Signed)
 Pt's husband requesting call from Dr. Zollie nurse or CMA.   Answer Assessment - Initial Assessment Questions 1. REASON FOR CALL: What is the main reason for your call? or How can I best help you?  Pts husband called to reschedule visit today as they are currently en route to the ED.   Also requesting a call back from Dr. Zollie CMA or nurse.  Gina Alexander (Spouse) [phone] (Mobile)  Protocols used: Information Only Call - No Triage-A-AH Copied from CRM [id number]. Topic: Clinical - Red Word Triage >> Apr 22, 2024 11:53 AM [name] wrote: Red Word that prompted transfer to Nurse Triage: [name], the pt husband is calling to r/s appt for 11/13. Pt is on her way to the hospital for dizziness and disoriented

## 2024-04-27 ENCOUNTER — Encounter: Payer: Self-pay | Admitting: Family Medicine

## 2024-04-27 ENCOUNTER — Ambulatory Visit: Admitting: Family Medicine

## 2024-04-27 ENCOUNTER — Ambulatory Visit: Payer: Self-pay | Admitting: Family Medicine

## 2024-04-27 VITALS — BP 148/81 | HR 56 | Temp 98.0°F | Ht 59.0 in | Wt 133.0 lb

## 2024-04-27 DIAGNOSIS — I10 Essential (primary) hypertension: Secondary | ICD-10-CM | POA: Diagnosis not present

## 2024-04-27 DIAGNOSIS — E782 Mixed hyperlipidemia: Secondary | ICD-10-CM | POA: Diagnosis not present

## 2024-04-27 DIAGNOSIS — I2510 Atherosclerotic heart disease of native coronary artery without angina pectoris: Secondary | ICD-10-CM | POA: Diagnosis not present

## 2024-04-27 DIAGNOSIS — U071 COVID-19: Secondary | ICD-10-CM

## 2024-04-27 DIAGNOSIS — E119 Type 2 diabetes mellitus without complications: Secondary | ICD-10-CM

## 2024-04-27 LAB — BAYER DCA HB A1C WAIVED: HB A1C (BAYER DCA - WAIVED): 6.2 % — ABNORMAL HIGH (ref 4.8–5.6)

## 2024-04-27 MED ORDER — NIRMATRELVIR/RITONAVIR (PAXLOVID)TABLET: 3.0000 | ORAL_TABLET | Freq: Two times a day (BID) | ORAL | 0 refills | Status: AC

## 2024-04-27 MED ORDER — PROMETHAZINE-DM 6.25-15 MG/5ML PO SYRP: 5.0000 mL | ORAL_SOLUTION | Freq: Four times a day (QID) | ORAL | Status: AC | PRN

## 2024-04-27 NOTE — Progress Notes (Signed)
 Subjective:  Patient ID: Gina Alexander, female    DOB: 02-Jan-1971  Age: 53 y.o. MRN: [id number]  CC: Covid Positive (Tested on positive. Still has headache, cough, chest pressure, dizziness. Started on Thursday. )   HPI  Discussed the use of AI scribe software for clinical note transcription with the patient, who gave verbal consent to proceed.  History of Present Illness Gina Alexander is a 53 year old female with HIV who presents with symptoms of COVID-19.  She has been experiencing chest pressure and a cough that is sometimes productive with clear phlegm. Additionally, she has head sinus congestion with clear nasal discharge. These symptoms have persisted since she became ill on Thursday, April 22, 2024.  On the day she became ill, she experienced dizziness and disorientation at work, prompting her coworkers to call paramedics due to a slow heart rate in the forties. She was taken to [facility] where she was diagnosed with COVID-19. At the hospital, she received albuterol treatments and a prescription for amoxicillin for sinus issues. She was advised to take Tylenol  but cannot take ibuprofen  due to being on blood thinners.  She was prescribed a cough syrup, identified as promethazine dextromethorphan, which she takes as needed, up to four times a day. The cough syrup makes her sleepy. She also mentions difficulty breathing, specifically in exhaling, which triggers coughing.  She has been sick for three weeks and has been unable to work, impacting her financially as she does not have paid sick leave available. 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  Her A1c is noted to be 6.2. presents forFollow-up of diabetes. Patient denies symptoms such as polyuria, polydipsia, excessive hunger, nausea No significant hypoglycemic spells noted. Medications reviewed. Pt reports taking them regularly without  complication/adverse reaction being reported today.  Lab Results  Component Value Date   HGBA1C 6.2 (H) 04/27/2024   HGBA1C 6.3 (H) 01/22/2024              03/10/2024    3:32 PM 01/22/2024    2:03 PM 02/14/2023    3:36 PM  Depression screen PHQ 2/9  Decreased Interest 3 3 0  Down, Depressed, Hopeless 2 3 0  PHQ - 2 Score 5 6 0  Altered sleeping 2 0   Tired, decreased energy 1 2   Change in appetite 3 2   Feeling bad or failure about yourself  1 3   Trouble concentrating 1 0   Moving slowly or fidgety/restless 0 1   Suicidal thoughts 0 0   PHQ-9 Score 13  14    Difficult doing work/chores Somewhat difficult Somewhat difficult      Data saved with a previous flowsheet row definition    History Gina Alexander has a past medical history of Anemia, Anxiety, Arthritis, Depression, HIV infection (HCC), Hyperlipidemia, Hypertension, and Myocardial infarction (HCC).   She has a past surgical history that includes Cholecystectomy; Tubal ligation (06/10/2004); uterine ablation; Breast enhancement surgery; Fracture surgery; Cosmetic surgery; and Breast surgery.   Her family history includes Arthritis in her mother; COPD in her brother and mother; Depression in her brother and mother; Drug abuse in her brother and mother; Hearing loss in her brother and mother; Varicose Veins in her mother; Vision loss in her brother and mother.She reports that she has been smoking cigarettes. She quit smokeless tobacco use about 10 years ago. She reports current alcohol use of about 2.0 standard  drinks of alcohol per week. She reports that she does not use drugs.    ROS Review of Systems  Constitutional:  Negative for activity change, appetite change, chills and fever.  HENT:  Positive for congestion, postnasal drip, rhinorrhea and sinus pressure. Negative for ear discharge, ear pain, hearing loss, nosebleeds, sneezing and trouble swallowing.   Respiratory:  Negative for chest tightness and shortness of  breath.   Cardiovascular:  Negative for chest pain and palpitations.  Skin:  Negative for rash.    Objective:  BP (!) 148/81   Pulse (!) 56   Temp 98 F (36.7 C)   Ht 4' 11 (1.499 m)   Wt 133 lb (60.3 kg)   SpO2 98%   BMI 26.86 kg/m   BP Readings from Last 3 Encounters:  04/27/24 (!) 148/81  03/10/24 116/75  01/22/24 (!) 141/82    Wt Readings from Last 3 Encounters:  04/27/24 133 lb (60.3 kg)  03/10/24 128 lb (58.1 kg)  01/22/24 138 lb (62.6 kg)     Physical Exam Constitutional:      General: She is not in acute distress.    Appearance: She is well-developed.  Cardiovascular:     Rate and Rhythm: Normal rate and regular rhythm.  Pulmonary:     Breath sounds: Normal breath sounds.  Musculoskeletal:        General: Normal range of motion.  Skin:    General: Skin is warm and dry.  Neurological:     Mental Status: She is alert and oriented to person, place, and time.    Physical Exam GENERAL: Alert, cooperative, well developed, no acute distress HEENT: Normocephalic, normal oropharynx, moist mucous membranes CHEST: Clear to auscultation bilaterally, no wheezes, rhonchi, or crackles, lungs normal CARDIOVASCULAR: Normal heart rate and rhythm, S1 and S2 normal without murmurs ABDOMEN: Soft, non-tender, non-distended, without organomegaly, normal bowel sounds EXTREMITIES: No cyanosis or edema NEUROLOGICAL: Cranial nerves grossly intact, moves all extremities without gross motor or sensory deficit   Assessment & Plan:  Type 2 diabetes mellitus without complication, without long-term current use of insulin (HCC) -     Microalbumin / creatinine urine ratio -     Bayer DCA Hb A1c Waived  Mixed hyperlipidemia -     CBC with Differential/Platelet -     Lipid panel  Essential hypertension -     CBC with Differential/Platelet  ASCVD (arteriosclerotic cardiovascular disease) -     Lipid panel  COVID-19 virus infection -     nirmatrelvir/ritonavir; Take 3 tablets  by mouth 2 (two) times daily for 5 days. (Take nirmatrelvir 150 mg two tablets twice daily for 5 days and ritonavir 100 mg one tablet twice daily for 5 days) Patient GFR is 77  Dispense: 30 tablet; Refill: 0 -     Promethazine-DM; Take 5 mLs by mouth 4 (four) times daily as needed for up to 5 days for cough.    Assessment and Plan Assessment & Plan COVID-19 infection   Diagnosed with COVID-19, she presents with chest pressure, cough, dizziness, and disorientation. Hospitalized with bradycardia, she received albuterol and cough syrup. Initially, Paxlovid was not prescribed. Symptoms include a productive cough with clear phlegm and sinus congestion. She is advised to stay home until Monday due to prolonged recovery time associated with HIV. Prescribe Paxlovid, three tablets in the morning and three in the evening for five days. Provide a work note for absence due to COVID-19. Continue current cough syrup as needed.  HIV infection  She has a known HIV infection, which may prolong recovery from COVID-19. She is currently on quetiapine , which is safe to continue with Paxlovid due to the low dose. Continue quetiapine  as prescribed.  Type 2 diabetes mellitus   Her A1c is well-controlled at 6.2%. Continue current diabetes management plan.       Follow-up: Return in about 3 months (around 07/28/2024).  Butler Der, M.D.

## 2024-04-28 ENCOUNTER — Other Ambulatory Visit: Payer: Self-pay | Admitting: Family Medicine

## 2024-04-28 ENCOUNTER — Encounter: Payer: Self-pay | Admitting: Family Medicine

## 2024-04-28 LAB — LIPID PANEL
Chol/HDL Ratio: 3.9 ratio (ref 0.0–4.4)
Cholesterol, Total: 148 mg/dL (ref 100–199)
HDL: 38 mg/dL — ABNORMAL LOW (ref >39)
LDL Chol Calc (NIH): 64 mg/dL (ref 0–99)
Triglycerides: 290 mg/dL — ABNORMAL HIGH (ref 0–149)
VLDL Cholesterol Cal: 46 mg/dL — ABNORMAL HIGH (ref 5–40)

## 2024-04-28 LAB — CBC WITH DIFFERENTIAL/PLATELET
Basophils Absolute: 0.1 x10E3/uL (ref 0.0–0.2)
Basos: 1 %
EOS (ABSOLUTE): 0.3 x10E3/uL (ref 0.0–0.4)
Eos: 2 %
Hematocrit: 50.3 % — ABNORMAL HIGH (ref 34.0–46.6)
Hemoglobin: 16.2 g/dL — ABNORMAL HIGH (ref 11.1–15.9)
Immature Grans (Abs): 0.4 x10E3/uL — ABNORMAL HIGH (ref 0.0–0.1)
Immature Granulocytes: 2 %
Lymphocytes Absolute: 5.6 x10E3/uL — ABNORMAL HIGH (ref 0.7–3.1)
Lymphs: 33 %
MCH: 30.9 pg (ref 26.6–33.0)
MCHC: 32.2 g/dL (ref 31.5–35.7)
MCV: 96 fL (ref 79–97)
Monocytes Absolute: 1.1 x10E3/uL — ABNORMAL HIGH (ref 0.1–0.9)
Monocytes: 6 %
Neutrophils Absolute: 9.4 x10E3/uL — ABNORMAL HIGH (ref 1.4–7.0)
Neutrophils: 56 %
Platelets: 267 x10E3/uL (ref 150–450)
RBC: 5.25 x10E6/uL (ref 3.77–5.28)
RDW: 13.3 % (ref 11.7–15.4)
WBC: 16.8 x10E3/uL — ABNORMAL HIGH (ref 3.4–10.8)

## 2024-04-28 LAB — MICROALBUMIN / CREATININE URINE RATIO
Creatinine, Urine: 26 mg/dL
Microalb/Creat Ratio: <12 mg/g{creat} (ref 0–29)
Microalbumin, Urine: <3 ug/mL

## 2024-07-28 ENCOUNTER — Ambulatory Visit: Admitting: Family Medicine

## 2025-03-29 ENCOUNTER — Ambulatory Visit: Payer: Self-pay
